# Patient Record
Sex: Female | Born: 1975 | Race: White | Hispanic: No | Marital: Married | Smoking: Current some day smoker
Health system: Southern US, Community
[De-identification: ages and names within clinical notes are randomized; demographics above are authoritative.]

## PROBLEM LIST (undated history)

## (undated) DIAGNOSIS — C801 Malignant (primary) neoplasm, unspecified: Secondary | ICD-10-CM

## (undated) DIAGNOSIS — E039 Hypothyroidism, unspecified: Secondary | ICD-10-CM

## (undated) DIAGNOSIS — F419 Anxiety disorder, unspecified: Secondary | ICD-10-CM

## (undated) DIAGNOSIS — F32A Depression, unspecified: Secondary | ICD-10-CM

## (undated) DIAGNOSIS — R112 Nausea with vomiting, unspecified: Secondary | ICD-10-CM

## (undated) DIAGNOSIS — F329 Major depressive disorder, single episode, unspecified: Secondary | ICD-10-CM

## (undated) DIAGNOSIS — T4145XA Adverse effect of unspecified anesthetic, initial encounter: Secondary | ICD-10-CM

## (undated) DIAGNOSIS — Z9889 Other specified postprocedural states: Secondary | ICD-10-CM

## (undated) DIAGNOSIS — D649 Anemia, unspecified: Secondary | ICD-10-CM

## (undated) DIAGNOSIS — T8859XA Other complications of anesthesia, initial encounter: Secondary | ICD-10-CM

## (undated) HISTORY — PX: ABDOMINAL HYSTERECTOMY: SHX81

## (undated) HISTORY — PX: OTHER SURGICAL HISTORY: SHX169

## (undated) HISTORY — PX: TUBAL LIGATION: SHX77

## (undated) HISTORY — PX: TONSILLECTOMY: SUR1361

---

## 2003-07-03 HISTORY — PX: REDUCTION MAMMAPLASTY: SUR839

## 2005-07-12 ENCOUNTER — Emergency Department (HOSPITAL_COMMUNITY): Admission: EM | Admit: 2005-07-12 | Discharge: 2005-07-12 | Payer: Self-pay | Admitting: Emergency Medicine

## 2005-07-16 ENCOUNTER — Other Ambulatory Visit: Admission: RE | Admit: 2005-07-16 | Discharge: 2005-07-16 | Payer: Self-pay | Admitting: Obstetrics and Gynecology

## 2006-11-13 ENCOUNTER — Inpatient Hospital Stay (HOSPITAL_COMMUNITY): Admission: AD | Admit: 2006-11-13 | Discharge: 2006-11-13 | Payer: Self-pay | Admitting: Obstetrics & Gynecology

## 2006-12-18 ENCOUNTER — Ambulatory Visit (HOSPITAL_COMMUNITY): Admission: RE | Admit: 2006-12-18 | Discharge: 2006-12-18 | Payer: Self-pay | Admitting: Obstetrics and Gynecology

## 2006-12-26 ENCOUNTER — Inpatient Hospital Stay (HOSPITAL_COMMUNITY): Admission: RE | Admit: 2006-12-26 | Discharge: 2006-12-29 | Payer: Self-pay | Admitting: Obstetrics and Gynecology

## 2007-02-17 ENCOUNTER — Ambulatory Visit (HOSPITAL_COMMUNITY): Admission: RE | Admit: 2007-02-17 | Discharge: 2007-02-17 | Payer: Self-pay | Admitting: Obstetrics and Gynecology

## 2008-03-11 ENCOUNTER — Emergency Department (HOSPITAL_BASED_OUTPATIENT_CLINIC_OR_DEPARTMENT_OTHER): Admission: EM | Admit: 2008-03-11 | Discharge: 2008-03-11 | Payer: Self-pay | Admitting: Emergency Medicine

## 2009-01-18 ENCOUNTER — Emergency Department (HOSPITAL_COMMUNITY): Admission: EM | Admit: 2009-01-18 | Discharge: 2009-01-18 | Payer: Self-pay | Admitting: Emergency Medicine

## 2009-11-09 ENCOUNTER — Encounter: Admission: RE | Admit: 2009-11-09 | Discharge: 2009-11-09 | Payer: Self-pay | Admitting: Emergency Medicine

## 2009-11-21 ENCOUNTER — Ambulatory Visit (HOSPITAL_COMMUNITY): Admission: RE | Admit: 2009-11-21 | Discharge: 2009-11-21 | Payer: Self-pay | Admitting: Obstetrics and Gynecology

## 2010-03-09 ENCOUNTER — Ambulatory Visit (HOSPITAL_COMMUNITY): Admission: RE | Admit: 2010-03-09 | Discharge: 2010-03-09 | Payer: Self-pay | Admitting: Obstetrics and Gynecology

## 2010-03-15 ENCOUNTER — Encounter (INDEPENDENT_AMBULATORY_CARE_PROVIDER_SITE_OTHER): Payer: Self-pay | Admitting: Obstetrics and Gynecology

## 2010-03-15 ENCOUNTER — Ambulatory Visit (HOSPITAL_COMMUNITY): Admission: RE | Admit: 2010-03-15 | Discharge: 2010-03-16 | Payer: Self-pay | Admitting: Obstetrics and Gynecology

## 2010-07-14 ENCOUNTER — Emergency Department (HOSPITAL_BASED_OUTPATIENT_CLINIC_OR_DEPARTMENT_OTHER)
Admission: EM | Admit: 2010-07-14 | Discharge: 2010-07-14 | Payer: Self-pay | Source: Home / Self Care | Admitting: Emergency Medicine

## 2010-09-14 LAB — CBC
HCT: 34.5 % — ABNORMAL LOW (ref 36.0–46.0)
Hemoglobin: 15.5 g/dL — ABNORMAL HIGH (ref 12.0–15.0)
MCH: 30.8 pg (ref 26.0–34.0)
MCV: 92.9 fL (ref 78.0–100.0)
Platelets: 123 10*3/uL — ABNORMAL LOW (ref 150–400)
Platelets: 146 10*3/uL — ABNORMAL LOW (ref 150–400)
RBC: 3.72 MIL/uL — ABNORMAL LOW (ref 3.87–5.11)
RBC: 4.36 MIL/uL (ref 3.87–5.11)
RDW: 12.6 % (ref 11.5–15.5)
RDW: 13.4 % (ref 11.5–15.5)
WBC: 10.2 10*3/uL (ref 4.0–10.5)

## 2010-09-14 LAB — SURGICAL PCR SCREEN
MRSA, PCR: NEGATIVE
Staphylococcus aureus: NEGATIVE

## 2010-09-18 LAB — CBC
Hemoglobin: 15 g/dL (ref 12.0–15.0)
MCHC: 34.3 g/dL (ref 30.0–36.0)
Platelets: 161 10*3/uL (ref 150–400)
RBC: 4.78 MIL/uL (ref 3.87–5.11)
WBC: 6 10*3/uL (ref 4.0–10.5)

## 2010-09-18 LAB — PREGNANCY, URINE: Preg Test, Ur: NEGATIVE

## 2010-10-08 LAB — CBC
Hemoglobin: 15.1 g/dL — ABNORMAL HIGH (ref 12.0–15.0)
MCHC: 33.3 g/dL (ref 30.0–36.0)
Platelets: 151 10*3/uL (ref 150–400)
RBC: 5.06 MIL/uL (ref 3.87–5.11)
WBC: 9.7 10*3/uL (ref 4.0–10.5)

## 2010-10-08 LAB — COMPREHENSIVE METABOLIC PANEL
ALT: 21 U/L (ref 0–35)
AST: 22 U/L (ref 0–37)
Alkaline Phosphatase: 49 U/L (ref 39–117)
BUN: 11 mg/dL (ref 6–23)
Calcium: 9.3 mg/dL (ref 8.4–10.5)
Glucose, Bld: 87 mg/dL (ref 70–99)
Potassium: 3.9 mEq/L (ref 3.5–5.1)
Sodium: 139 mEq/L (ref 135–145)

## 2010-10-08 LAB — DIFFERENTIAL
Basophils Absolute: 0 10*3/uL (ref 0.0–0.1)
Monocytes Absolute: 0.3 10*3/uL (ref 0.1–1.0)
Neutro Abs: 8.5 10*3/uL — ABNORMAL HIGH (ref 1.7–7.7)

## 2010-10-08 LAB — LIPASE, BLOOD: Lipase: 24 U/L (ref 11–59)

## 2010-10-08 LAB — URINALYSIS, ROUTINE W REFLEX MICROSCOPIC
Glucose, UA: NEGATIVE mg/dL
Hgb urine dipstick: NEGATIVE
Protein, ur: NEGATIVE mg/dL
Specific Gravity, Urine: 1.012 (ref 1.005–1.030)

## 2010-10-08 LAB — PREGNANCY, URINE: Preg Test, Ur: NEGATIVE

## 2010-11-14 NOTE — Discharge Summary (Signed)
NAMEELOWYN, RAUPP NO.:  [id number]   MEDICAL RECORD NO.:  [id number]          PATIENT TYPE:  INP   LOCATION:  9104                          FACILITY:  [facility]   PHYSICIAN:  Randye Lobo, M.D.   DATE OF BIRTH:  [birth year]   DATE OF ADMISSION:  12/26/2006  DATE OF DISCHARGE:  12/29/2006                               DISCHARGE SUMMARY   FINAL DIAGNOSIS:  Intrauterine pregnancy at term.  History of prior  cesarean section.  The patient desires repeat cesarean section.   PROCEDURE:  Repeat low transverse cesarean section.  Surgeon Dr. Malva Limes.  Assistant Dr. Conley Simmonds.   COMPLICATIONS:  None.   HISTORY OF PRESENT ILLNESS:  This 35 year old G2, P1-0-0-1 presents at  term for repeat cesarean section.  The patient had had a prior cesarean  section with her last pregnancy and desires repeat with this pregnancy  as well.  Otherwise, the patient's antepartum course up to this point  has been uncomplicated.  She did have an abnormal one-hour sugar test  but passed her 3-hour sugar test.  The patient was admitted taken to the  operating room on December 26, 2006, by Dr. Malva Limes where a repeat low  transverse cesarean section was performed with the delivery of a 7 pound  6 ounce female infant with Apgars of 8 and 9.  Delivery went without  complications.  The patient's postoperative course was benign without  any significant fevers.  The patient had some mild postoperative anemia,  was told to start iron on her post hospital course.  The patient was  felt ready for discharge on postoperative day #3.  The baby was having  some tests due to some frequent stools at this time, and she was felt  ready for discharge.  She was sent home on a regular diet, told to  decrease activities, told to continue her prenatal vitamins and her iron  supplement three times daily, was given Percocet 1-2 every 4-6 hours as  needed for pain, told she could use ibuprofen up to 600 mg  every 6 hours  as needed for pain, was to follow up in our office on June 30 for staple  removal.  The patient was also to use AZO Standard as standard as needed  for some dysuria.  She did have a negative urinalysis that was performed  on the 28th.  Instructions and precautions were reviewed with the  patient.   LABS ON DISCHARGE:  The patient had a hemoglobin of 9.3, white blood  cell count of 6.7, platelets of 119,000 and a negative urinalysis.      Leilani Able, P.A.-C.      Randye Lobo, M.D.  Electronically Signed    MB/MEDQ  D:  02/04/2007  T:  02/04/2007  Job:  [id number]

## 2010-11-14 NOTE — Op Note (Signed)
NAME:  Andrea Chapman, Andrea Chapman NO.:  [id number]   MEDICAL RECORD NO.:  [id number]          PATIENT TYPE:  AMB   LOCATION:  SDC                           [facility]ILITY:  [facility]   PHYSICIAN:  Malva Limes, M.D.    DATE OF BIRTH:  [birth year]   DATE OF PROCEDURE:  02/17/2007  DATE OF DISCHARGE:                               OPERATIVE REPORT   PREOPERATIVE DIAGNOSIS:  The patient desires permanent sterilization.   POSTOPERATIVE DIAGNOSIS:  The patient desires permanent sterilization.   PROCEDURE:  Bilateral tubal ligation with Filshie clips.   SURGEON:  Malva Limes, M.D.   ANESTHESIA:  General endotracheal.   ANTIBIOTICS:  Ancef 1 g.   DRAINS:  Catheter, bladder.   ESTIMATED BLOOD LOSS:  Minimal.   COMPLICATIONS:  None.   SPECIMENS:  None.   DESCRIPTION OF PROCEDURE:  The patient was taken to the operating room  where a general anesthetic was administered without complication.  She  was then placed in the dorsal lithotomy position.  She was prepped with  Betadine.  A Hulka tenaculum was applied to the anterior cervical lip.  Her bladder was then drained.  Her umbilicus was then injected with  0.25% Marcaine.   A vertical skin incision was made.  The fascia was then grasped and  entered.  The parietal peritoneum was entered sharply.  An 0 Vicryl  suture was placed in a pursestring fashion.  The Hasson cannula was  placed into the peritoneal cavity.  3 L of carbon dioxide was  insufflated.  The patient was then placed in Trendelenburg.  A Filshie  clip was then placed on the isthmic portion of the left fallopian tube.  The entire tube appeared to be within the clasp.  The clasp appeared to  be tightly closed.  A similar procedure was performed on the opposite  side.  At this point the pneumoperitoneum and instruments were removed.  The fascia was closed with 0 Monocryl suture.  The skin was closed with  4-0 Vicryl suture.   The patient was extubated and taken to the  recovery room in stable  condition.  Instrument and lap counts were correct x1.  The patient was  discharged to home.  She will be discharged with Percocet.  She will  follow up in the office in 4 weeks.           ______________________________  Malva Limes, M.D.     MA/MEDQ  D:  02/17/2007  T:  02/17/2007  Job:  [id number]

## 2010-11-14 NOTE — Op Note (Signed)
Andrea Chapman, Andrea Chapman NO.:  [id number]   MEDICAL RECORD NO.:  [id number]          PATIENT TYPE:  INP   LOCATION:  9104                          [facility]ACILITY:  [facility]   PHYSICIAN:  Malva Limes, M.D.    DATE OF BIRTH:  29-Jul-1975   DATE OF PROCEDURE:  12/26/2006  DATE OF DISCHARGE:                               OPERATIVE REPORT   PREOPERATIVE DIAGNOSES:  1. Intrauterine pregnancy at term.  2. History of prior cesarean section.  3. Patient desires repeat cesarean section.   POSTOPERATIVE DIAGNOSES:  1. Intrauterine pregnancy at term.  2. History of prior cesarean section.  3. Patient desires repeat cesarean section.   PROCEDURE:  Repeat low transverse Cesarean section.   SURGEON:  Dareen Piano.   ASSISTANT:  Edward Jolly.   ANTIBIOTICS:  Ancef 1 gram.   DRAINS:  Foley to bedside drainage.   ESTIMATED BLOOD LOSS:  900 mL.   COMPLICATIONS:  None.   PROCEDURE:  The patient was taken to the operating room where spinal  anesthetic was administered without difficulty. Once this was completed,  the patient was placed in a dorsal supine position with a left lateral  tilt.  She was prepped with Betadine and draped in the usual fashion for  this procedure. A Foley catheter was placed. The patient had a  Pfannenstiel incision made through a previous abdominoplasty scar. This  was carried down to the fascia. The fascia was entered in the midline  and extended laterally with the Mayo scissors. The rectus muscles were  divided in the midline and taken superiorly and inferiorly. The parietal  peritoneum was entered sharply and taken superiorly and inferiorly.  There were several adhesions between the anterior abdominal wall and the  uterus. These were taken down with sharp dissection. A low-transverse  uterine incision was made in the midline and extended laterally with  blunt dissection. The infant was delivered in the vertex presentation  with the vacuum extractor. Once the head  was delivered, a nuchal cord x1  was reduced. The remaining infant was then delivered, the cord was  doubly clamped and cut, and the infant handed to the waiting NICU team.  Placenta was then manually removed. The uterus was exteriorized. The  uterine cavity was examined and wiped with a wet lap. Uterine incision  was closed in a single layer of 0 Monocryl in a running locking fashion.  At this point, hemostasis was checked and felt to be adequate. Parietal  peritoneum and rectus muscles were reapproximated in the midline using 0  Monocryl suture. The fascia was closed in a running fashion using 0  Monocryl suture. The fascia was closed in a running fashion using 0  Monocryl  suture. Subcuticular tissue was made hemostatic with a Bovie. Stainless  steel clips were used to close the skin. The patient tolerated the  procedure well. He was taken to the recovery room in stable condition.  She delivered one live viable female infant weighing 7 pounds and 6  ounces. Apgars were 8 at one minute and 9 at five minutes.  ______________________________  Malva Limes, M.D.     MA/MEDQ  D:  12/26/2006  T:  12/26/2006  Job:  [id number]

## 2010-11-24 ENCOUNTER — Other Ambulatory Visit: Payer: Self-pay | Admitting: Surgery

## 2011-04-13 LAB — CBC
Hemoglobin: 13
MCV: 83.5

## 2011-04-18 LAB — URINALYSIS, ROUTINE W REFLEX MICROSCOPIC
Nitrite: NEGATIVE
Specific Gravity, Urine: 1.01
Urobilinogen, UA: 0.2
pH: 6

## 2011-04-18 LAB — CBC
HCT: 27.3 — ABNORMAL LOW
HCT: 37.6
Hemoglobin: 12.6
MCHC: 33.4
MCHC: 34
MCV: 85.9
Platelets: 156
RBC: 3.18 — ABNORMAL LOW
RDW: 15.1 — ABNORMAL HIGH
WBC: 6.7

## 2011-04-18 LAB — URINE CULTURE: Culture: NO GROWTH

## 2015-04-05 ENCOUNTER — Other Ambulatory Visit: Payer: Self-pay | Admitting: *Deleted

## 2015-04-05 ENCOUNTER — Other Ambulatory Visit: Payer: Self-pay | Admitting: Family Medicine

## 2015-04-05 DIAGNOSIS — Z1231 Encounter for screening mammogram for malignant neoplasm of breast: Secondary | ICD-10-CM

## 2015-04-13 ENCOUNTER — Ambulatory Visit
Admission: RE | Admit: 2015-04-13 | Discharge: 2015-04-13 | Disposition: A | Discharge status: Home / Self Care | Payer: Self-pay | Source: Ambulatory Visit | Attending: Family Medicine | Admitting: Family Medicine

## 2015-04-13 DIAGNOSIS — Z1231 Encounter for screening mammogram for malignant neoplasm of breast: Secondary | ICD-10-CM

## 2015-04-13 HISTORY — DX: Malignant (primary) neoplasm, unspecified: C80.1

## 2015-08-11 ENCOUNTER — Other Ambulatory Visit: Payer: Self-pay | Admitting: Obstetrics and Gynecology

## 2015-08-11 DIAGNOSIS — R102 Pelvic and perineal pain: Secondary | ICD-10-CM

## 2015-08-12 ENCOUNTER — Ambulatory Visit
Admission: RE | Admit: 2015-08-12 | Discharge: 2015-08-12 | Disposition: A | Discharge status: Home / Self Care | Payer: Self-pay | Source: Ambulatory Visit | Attending: Obstetrics and Gynecology | Admitting: Obstetrics and Gynecology

## 2015-08-12 DIAGNOSIS — Z85828 Personal history of other malignant neoplasm of skin: Secondary | ICD-10-CM | POA: Insufficient documentation

## 2015-08-12 DIAGNOSIS — R102 Pelvic and perineal pain: Secondary | ICD-10-CM | POA: Insufficient documentation

## 2015-08-12 DIAGNOSIS — Z90710 Acquired absence of both cervix and uterus: Secondary | ICD-10-CM | POA: Insufficient documentation

## 2015-08-12 MED ORDER — IOHEXOL 350 MG/ML SOLN
100.0000 mL | Freq: Once | INTRAVENOUS | Status: AC | PRN
  Administered 2015-08-12: 100 mL via INTRAVENOUS

## 2016-05-16 ENCOUNTER — Ambulatory Visit
Admission: RE | Admit: 2016-05-16 | Discharge: 2016-05-16 | Disposition: A | Discharge status: Home / Self Care | Payer: Self-pay | Source: Ambulatory Visit | Attending: Oncology | Admitting: Oncology

## 2016-05-16 ENCOUNTER — Ambulatory Visit: Payer: Self-pay | Attending: Oncology

## 2016-05-16 VITALS — BP 129/86 | HR 83 | Temp 97.9°F | Ht 64.57 in | Wt 164.2 lb

## 2016-05-16 DIAGNOSIS — Z0000 Encounter for general adult medical examination without abnormal findings: Secondary | ICD-10-CM

## 2016-05-16 NOTE — Progress Notes (Signed)
Subjective:     Patient ID: Andrea Chapman, female   DOB: Feb 15, 1976, 40 y.o.   MRN: [id number]  HPI   Review of Systems     Objective:   Physical Exam  Pulmonary/Chest: Right breast exhibits no inverted nipple, no mass, no nipple discharge, no skin change and no tenderness. Left breast exhibits no inverted nipple, no mass, no nipple discharge, no skin change and no tenderness. Breasts are symmetrical.         Assessment:     40 year old patient presents for [facility] clinic visit.  Patient screened, and meets BCCCP eligibility.  Patient does not have insurance, Medicare or Medicaid.  Handout given on Affordable Care Act.  Instructed patient on breast self-exam using teach back method.  CBE unremarkable. History of bilateral breast lift. No mass or lump palpated.    Plan:     Sent for bilateral screening mammogram.

## 2016-05-22 NOTE — Progress Notes (Signed)
Letter mailed from [facility] to notify of normal mammogram results.  Patient to return in one year for annual screening.  Copy to [facility]. 

## 2017-01-08 ENCOUNTER — Encounter
Admission: RE | Admit: 2017-01-08 | Discharge: 2017-01-08 | Disposition: A | Discharge status: Home / Self Care | Payer: 59 | Source: Ambulatory Visit | Attending: Obstetrics and Gynecology | Admitting: Obstetrics and Gynecology

## 2017-01-08 DIAGNOSIS — Z01818 Encounter for other preprocedural examination: Secondary | ICD-10-CM | POA: Insufficient documentation

## 2017-01-08 DIAGNOSIS — N83201 Unspecified ovarian cyst, right side: Secondary | ICD-10-CM | POA: Diagnosis not present

## 2017-01-08 HISTORY — DX: Anemia, unspecified: D64.9

## 2017-01-08 HISTORY — DX: Other complications of anesthesia, initial encounter: T88.59XA

## 2017-01-08 HISTORY — DX: Adverse effect of unspecified anesthetic, initial encounter: T41.45XA

## 2017-01-08 HISTORY — DX: Depression, unspecified: F32.A

## 2017-01-08 HISTORY — DX: Major depressive disorder, single episode, unspecified: F32.9

## 2017-01-08 HISTORY — DX: Anxiety disorder, unspecified: F41.9

## 2017-01-08 HISTORY — DX: Hypothyroidism, unspecified: E03.9

## 2017-01-08 LAB — BASIC METABOLIC PANEL
ANION GAP: 10 (ref 5–15)
BUN: 13 mg/dL (ref 6–20)
CHLORIDE: 107 mmol/L (ref 101–111)
CO2: 23 mmol/L (ref 22–32)
Calcium: 9.5 mg/dL (ref 8.9–10.3)
Creatinine, Ser: 0.74 mg/dL (ref 0.44–1.00)
GFR calc non Af Amer: >60 mL/min (ref >60)
GLUCOSE: 75 mg/dL (ref 65–99)
Potassium: 4.3 mmol/L (ref 3.5–5.1)
Sodium: 140 mmol/L (ref 135–145)

## 2017-01-08 LAB — CBC
HEMATOCRIT: 45.3 % (ref 35.0–47.0)
Hemoglobin: 15.4 g/dL (ref 12.0–16.0)
MCH: 30 pg (ref 26.0–34.0)
MCHC: 34 g/dL (ref 32.0–36.0)
MCV: 88.3 fL (ref 80.0–100.0)
PLATELETS: 206 10*3/uL (ref 150–440)
RBC: 5.12 MIL/uL (ref 3.80–5.20)
RDW: 12.7 % (ref 11.5–14.5)
WBC: 7.9 10*3/uL (ref 3.6–11.0)

## 2017-01-08 LAB — TYPE AND SCREEN
ABO/RH(D): A POS
ANTIBODY SCREEN: NEGATIVE

## 2017-01-08 NOTE — Patient Instructions (Signed)
Your procedure is scheduled on: 01/21/17 Mon Report to Same Day Surgery 2nd floor medical mall [facility] Entrance-take elevator on left to 2nd floor.  Check in with surgery information desk.) To find out your arrival time please call [phone] between 1PM - 3PM on 7/2o/18 Fri   Remember: Instructions that are not followed completely may result in serious medical risk, up to and including death, or upon the discretion of your surgeon and anesthesiologist your surgery may need to be rescheduled.    _x___ 1. Do not eat food or drink liquids after midnight. No gum chewing or                              hard candies.     __x__ 2. No Alcohol for 24 hours before or after surgery.   __x__3. No Smoking for 24 prior to surgery.   ____  4. Bring all medications with you on the day of surgery if instructed.    __x__ 5. Notify your doctor if there is any change in your medical condition     (cold, fever, infections).     Do not wear jewelry, make-up, hairpins, clips or nail polish.  Do not wear lotions, powders, or perfumes. You may wear deodorant.  Do not shave 48 hours prior to surgery. Men may shave face and neck.  Do not bring valuables to the hospital.    [facility] is not responsible for any belongings or valuables.               Contacts, dentures or bridgework may not be worn into surgery.  Leave your suitcase in the car. After surgery it may be brought to your room.  For patients admitted to the hospital, discharge time is determined by your                       treatment team.   Patients discharged the day of surgery will not be allowed to drive home.  You will need someone to drive you home and stay with you the night of your procedure.    Please read over the following fact sheets that you were given:   [facility] Preparing for Surgery and or MRSA Information   _x___ Take anti-hypertensive (unless it includes a diuretic), cardiac, seizure, asthma,     anti-reflux and  psychiatric medicines. These include:  1. clonazePAM (KLONOPIN)  2.(NORCO/VICODIN if needed  3.levothyroxine (SYNTHROID  4.venlafaxine (EFFEXOR  5.  6.  ____Fleets enema or Magnesium Citrate as directed.   _x___ Use CHG Soap or sage wipes as directed on instruction sheet   ____ Use inhalers on the day of surgery and bring to hospital day of surgery  ____ Stop Metformin and Janumet 2 days prior to surgery.    ____ Take 1/2 of usual insulin dose the night before surgery and none on the morning     surgery.   _x___ Follow recommendations from Cardiologist, Pulmonologist or PCP regarding          stopping Aspirin, Coumadin, Pllavix ,Eliquis, Effient, or Pradaxa, and Pletal.  X____Stop Anti-inflammatories such as Advil, Aleve, Ibuprofen, Motrin, Naproxen, Naprosyn, Goodies powders or aspirin products. OK to take Tylenol and                          Celebrex.   _x___ Stop supplements until after surgery.  But  may continue Vitamin D, Vitamin B,       and multivitamin.   ____ Bring C-Pap to the hospital.

## 2017-01-08 NOTE — H&P (Signed)
Pelvic pain:  Right lower quadrant pain, distressing particularly with intercourse and worse when entering from behind. Often wraps around from right lower back to anterior, often pain with deep penetration that used to be improved with position change but is now in all positions.   On exam, MSK related and I have referred her to pelvic floor PT.  Ultrasound today wnl. Normal bilateral ovaries, with a 1cm cyst x2 on right ovary. Prior hysterectomy.  Pt brings with her a pelvic xray from her chiropractor which shows a loose Filshie clip in the right lower pelvis.   Hx of significant pelvic adhesions taken down with TAH in 2012. Prior C/S x2, TAH, lap BTL, and "tummy tuck" in surgical past.  - TVUS today found: Hyst ROV simple cysts: 1) 1.21cm 2) 1.22cm LOV appears wnl No FF seen in CDS's  +urinary frequency, urgency and bladder pain occasionally  Past Medical History:  has a past medical history of Depression, unspecified; Dysmenorrhea; Hypothyroidism, unspecified; Melanoma (CMS-HCC) (2010); Tobacco use; and Vitamin B12 deficiency.  Past Surgical History:  has a past surgical history that includes Hysterectomy (2012); Tubal ligation (2008); Breast surgery (2005); Combined abdominoplasty and liposuction (2005); and Cesarean section (2003 & 2008). Family History: family history includes Breast cancer in her maternal aunt; Diabetes in her mother; High blood pressure (Hypertension) in her mother; Hyperlipidemia (Elevated cholesterol) in her mother. Social History:  reports that she has been smoking Cigarettes.  She has been smoking about 1.00 pack per day. She has never used smokeless tobacco. She reports that she drinks alcohol. She reports that she does not use drugs. OB/GYN History:          OB History    Gravida Para Term Preterm AB Living   2 2 2     2    SAB TAB Ectopic Molar Multiple Live Births                    Allergies: is  allergic to morphine; reglan [metoclopramide hcl]; and azithromycin. Medications:  Current Outpatient Prescriptions:  .  clonazePAM (KLONOPIN) 0.5 MG tablet, Take 0.5 mg by mouth 2 (two) times daily as needed for Anxiety., Disp: , Rfl:  .  HYDROcodone-acetaminophen (NORCO) 5-325 mg tablet, Take 1 tablet by mouth every 6 (six) hours as needed for Pain., Disp: , Rfl:  .  levothyroxine (SYNTHROID, LEVOTHROID) 200 MCG tablet, Take 200 mcg by mouth once daily. Take on an empty stomach with a glass of water at least 30-60 minutes before breakfast., Disp: , Rfl:  .  venlafaxine (EFFEXOR) 100 MG tablet, Take 100 mg by mouth 3 (three) times daily., Disp: , Rfl:   Review of Systems: No SOB, no palpitations or chest pain, no new lower extremity edema, no nausea or vomiting or bowel or bladder complaints. See HPI for gyn specific ROS.    Exam:      Vitals:   12/25/16 1504  BP: (!) 136/96  Pulse: 96   Body mass index is 29.35 kg/m.  General: Well-developed, well-nourished  female in no acute distress Body mass index is 29.35 kg/m. Skin: No rashes, ulcers or skin lesions noted. No excessive hirsutism or acne noted.  Pulm: CTAB,  CV: RRR, no murmurs Neurological: Appears alert and oriented and is a good historian. No gross abnormalities are noted. Psychological: Normal affect and mood. No signs of anxiety or depression noted.  Pelvic exam: Pelvic:              External  genitalia: vulva and labia without lesions, Tanner stage 5             Urethra: no prolapse             Vagina: normal physiologic d/c             Cuff: no lesions, no cervical motion tenderness               Uterus: surgically absent                    Adnexa: no mass,  non-tender               Rectovaginal: external exam normal  +TTP along right vaginal wall, +obturator tenderness, +bladder tenderness  Impression:   The primary encounter diagnosis was Pelvic pain in female. A diagnosis of Ovarian cyst, right  was also pertinent to this visit.    Plan:   - Patient returns for a preoperative discussion regarding her plans to proceed with surgical treatment of her right ovarian cysts with chronic pain by dx lap with right oopherectomy procedure.If we can find it, we will also remove her Filshie clip in her pelvis. We will also proceed with cystoscopy for hydro distention and bladder pain.  Because of her prior hx of significant pelvic adhesions taken down with TAH in 2012. Prior C/S x2, TAH, lap BTL, and "tummy tuck" in surgical past, she is aware of the possibility that are diagnostic laparoscopy will be unsuccessful at removing her right ovary.  If this occurs, we have decided that we would not proceed with an open incision, but that I would send her to the minimally invasive specialist.  She is aware that she is more at risk for abnormal intra-abdominal anatomy, and that that leads to surgical risk for organ damage.  She is aware that I would do an open case if needed to for safety.    I did offer to send her to the minimally invasive specialist at this time.  She declines, and would like to have her surgery locally if at all possible.  Because of her chronic pain and her family history of ovarian cancer premenopausally, I will proceed with a CA 125 prior to surgery.  However, my suspicion for malignancy in her case is very low.  The patient and I discussed the technical aspects of the procedure including the potential for risks and complications. These include but are not limited to the risk of infection requiring post-operative antibiotics or further procedures. We talked about the risk of injury to adjacent organs including bladder, bowel, ureter, blood vessels or nerves. We talked about the need to convert to an open incision. We talked about the possible need for blood transfusion. We talked aboutpostop complications such asthromboembolic or cardiopulmonary complications. All of  her questions were answered.  Her preoperative exam was completed and the appropriate consents were signed. She is scheduled to undergo this procedure in the near future.  Specific Peri-operative Considerations:  - Consent: obtained today - Health Maintenance: uptodate - Labs: CBC, CMP, CA125 preoperatively - Studies: EKG, CXR preoperatively - Bowel Preparation: None required - Abx:  None required - VTE ppx: SCDs perioperatively - Glucose Protocol: n/a - Beta-blockade: n/a

## 2017-01-21 ENCOUNTER — Ambulatory Visit
Admission: RE | Admit: 2017-01-21 | Discharge: 2017-01-21 | Disposition: A | Discharge status: Home / Self Care | Payer: 59 | Source: Ambulatory Visit | Attending: Obstetrics and Gynecology | Admitting: Obstetrics and Gynecology

## 2017-01-21 ENCOUNTER — Encounter: Payer: Self-pay | Admitting: *Deleted

## 2017-01-21 ENCOUNTER — Encounter
Admission: RE | Disposition: A | Discharge status: Home / Self Care | Payer: Self-pay | Source: Ambulatory Visit | Attending: Obstetrics and Gynecology

## 2017-01-21 ENCOUNTER — Ambulatory Visit: Payer: 59 | Admitting: Certified Registered Nurse Anesthetist

## 2017-01-21 DIAGNOSIS — Z881 Allergy status to other antibiotic agents status: Secondary | ICD-10-CM | POA: Diagnosis not present

## 2017-01-21 DIAGNOSIS — Z803 Family history of malignant neoplasm of breast: Secondary | ICD-10-CM | POA: Diagnosis not present

## 2017-01-21 DIAGNOSIS — N838 Other noninflammatory disorders of ovary, fallopian tube and broad ligament: Secondary | ICD-10-CM | POA: Insufficient documentation

## 2017-01-21 DIAGNOSIS — Z8041 Family history of malignant neoplasm of ovary: Secondary | ICD-10-CM | POA: Diagnosis not present

## 2017-01-21 DIAGNOSIS — G8929 Other chronic pain: Secondary | ICD-10-CM | POA: Diagnosis not present

## 2017-01-21 DIAGNOSIS — E669 Obesity, unspecified: Secondary | ICD-10-CM | POA: Diagnosis not present

## 2017-01-21 DIAGNOSIS — Z85820 Personal history of malignant melanoma of skin: Secondary | ICD-10-CM | POA: Insufficient documentation

## 2017-01-21 DIAGNOSIS — R102 Pelvic and perineal pain: Secondary | ICD-10-CM | POA: Insufficient documentation

## 2017-01-21 DIAGNOSIS — N8311 Corpus luteum cyst of right ovary: Secondary | ICD-10-CM | POA: Insufficient documentation

## 2017-01-21 DIAGNOSIS — Z833 Family history of diabetes mellitus: Secondary | ICD-10-CM | POA: Diagnosis not present

## 2017-01-21 DIAGNOSIS — Z6829 Body mass index (BMI) 29.0-29.9, adult: Secondary | ICD-10-CM | POA: Diagnosis not present

## 2017-01-21 DIAGNOSIS — N946 Dysmenorrhea, unspecified: Secondary | ICD-10-CM | POA: Diagnosis not present

## 2017-01-21 DIAGNOSIS — E039 Hypothyroidism, unspecified: Secondary | ICD-10-CM | POA: Insufficient documentation

## 2017-01-21 DIAGNOSIS — F329 Major depressive disorder, single episode, unspecified: Secondary | ICD-10-CM | POA: Diagnosis not present

## 2017-01-21 DIAGNOSIS — D649 Anemia, unspecified: Secondary | ICD-10-CM | POA: Insufficient documentation

## 2017-01-21 DIAGNOSIS — R350 Frequency of micturition: Secondary | ICD-10-CM | POA: Insufficient documentation

## 2017-01-21 DIAGNOSIS — Z885 Allergy status to narcotic agent status: Secondary | ICD-10-CM | POA: Diagnosis not present

## 2017-01-21 DIAGNOSIS — F418 Other specified anxiety disorders: Secondary | ICD-10-CM | POA: Diagnosis not present

## 2017-01-21 DIAGNOSIS — Z888 Allergy status to other drugs, medicaments and biological substances status: Secondary | ICD-10-CM | POA: Diagnosis not present

## 2017-01-21 DIAGNOSIS — Z8249 Family history of ischemic heart disease and other diseases of the circulatory system: Secondary | ICD-10-CM | POA: Diagnosis not present

## 2017-01-21 DIAGNOSIS — Z79899 Other long term (current) drug therapy: Secondary | ICD-10-CM | POA: Diagnosis not present

## 2017-01-21 DIAGNOSIS — N8301 Follicular cyst of right ovary: Secondary | ICD-10-CM | POA: Insufficient documentation

## 2017-01-21 DIAGNOSIS — F17210 Nicotine dependence, cigarettes, uncomplicated: Secondary | ICD-10-CM | POA: Insufficient documentation

## 2017-01-21 DIAGNOSIS — Z90710 Acquired absence of both cervix and uterus: Secondary | ICD-10-CM | POA: Insufficient documentation

## 2017-01-21 DIAGNOSIS — N736 Female pelvic peritoneal adhesions (postinfective): Secondary | ICD-10-CM | POA: Insufficient documentation

## 2017-01-21 HISTORY — PX: LAPAROSCOPIC BILATERAL SALPINGECTOMY: SHX5889

## 2017-01-21 HISTORY — PX: LAPAROSCOPY: SHX197

## 2017-01-21 HISTORY — PX: LAPAROSCOPIC LYSIS OF ADHESIONS: SHX5905

## 2017-01-21 LAB — ABO/RH: ABO/RH(D): A POS

## 2017-01-21 SURGERY — LAPAROSCOPY OPERATIVE
Anesthesia: General | Laterality: Right

## 2017-01-21 MED ORDER — SUCCINYLCHOLINE CHLORIDE 20 MG/ML IJ SOLN
INTRAMUSCULAR | Status: DC | PRN
  Administered 2017-01-21: 100 mg via INTRAVENOUS

## 2017-01-21 MED ORDER — SCOPOLAMINE 1 MG/3DAYS TD PT72
MEDICATED_PATCH | TRANSDERMAL | Status: AC
  Administered 2017-01-21: 1.5 mg via TRANSDERMAL
  Filled 2017-01-21: qty 1

## 2017-01-21 MED ORDER — OXYCODONE HCL 5 MG PO CAPS: 5.0000 mg | ORAL_CAPSULE | Freq: Four times a day (QID) | ORAL | 0 refills | Status: DC | PRN

## 2017-01-21 MED ORDER — ACETAMINOPHEN 10 MG/ML IV SOLN
INTRAVENOUS | Status: DC | PRN
  Administered 2017-01-21: 1000 mg via INTRAVENOUS

## 2017-01-21 MED ORDER — ACETAMINOPHEN 500 MG PO TABS
ORAL_TABLET | ORAL | Status: AC
  Filled 2017-01-21: qty 2

## 2017-01-21 MED ORDER — ONDANSETRON HCL 4 MG/2ML IJ SOLN
INTRAMUSCULAR | Status: AC
  Filled 2017-01-21: qty 2

## 2017-01-21 MED ORDER — GABAPENTIN 800 MG PO TABS: 800.0000 mg | ORAL_TABLET | Freq: Every day | ORAL | 0 refills | Status: DC

## 2017-01-21 MED ORDER — GABAPENTIN 300 MG PO CAPS
900.0000 mg | ORAL_CAPSULE | ORAL | Status: AC
  Administered 2017-01-21: 900 mg via ORAL

## 2017-01-21 MED ORDER — GABAPENTIN 300 MG PO CAPS
ORAL_CAPSULE | ORAL | Status: AC
  Filled 2017-01-21: qty 3

## 2017-01-21 MED ORDER — LIDOCAINE HCL (CARDIAC) 20 MG/ML IV SOLN
INTRAVENOUS | Status: DC | PRN
  Administered 2017-01-21: 80 mg via INTRAVENOUS

## 2017-01-21 MED ORDER — SUCCINYLCHOLINE CHLORIDE 20 MG/ML IJ SOLN
INTRAMUSCULAR | Status: AC
  Filled 2017-01-21: qty 1

## 2017-01-21 MED ORDER — PROPOFOL 10 MG/ML IV BOLUS
INTRAVENOUS | Status: DC | PRN
  Administered 2017-01-21: 200 mg via INTRAVENOUS

## 2017-01-21 MED ORDER — ROCURONIUM BROMIDE 50 MG/5ML IV SOLN
INTRAVENOUS | Status: AC
  Filled 2017-01-21: qty 1

## 2017-01-21 MED ORDER — PROPOFOL 500 MG/50ML IV EMUL
INTRAVENOUS | Status: DC | PRN
  Administered 2017-01-21: 20 ug/kg/min via INTRAVENOUS

## 2017-01-21 MED ORDER — BUPIVACAINE HCL (PF) 0.5 % IJ SOLN
INTRAMUSCULAR | Status: AC
Start: 2017-01-21 — End: 2017-01-21
  Filled 2017-01-21: qty 30

## 2017-01-21 MED ORDER — KETOROLAC TROMETHAMINE 30 MG/ML IJ SOLN
INTRAMUSCULAR | Status: DC | PRN
  Administered 2017-01-21: 30 mg via INTRAVENOUS

## 2017-01-21 MED ORDER — KETOROLAC TROMETHAMINE 30 MG/ML IJ SOLN
INTRAMUSCULAR | Status: AC
  Filled 2017-01-21: qty 1

## 2017-01-21 MED ORDER — ACETAMINOPHEN NICU IV SYRINGE 10 MG/ML
INTRAVENOUS | Status: AC
  Filled 2017-01-21: qty 2

## 2017-01-21 MED ORDER — SUGAMMADEX SODIUM 200 MG/2ML IV SOLN
INTRAVENOUS | Status: DC | PRN
  Administered 2017-01-21: 175 mg via INTRAVENOUS

## 2017-01-21 MED ORDER — FAMOTIDINE 20 MG PO TABS
20.0000 mg | ORAL_TABLET | Freq: Once | ORAL | Status: AC
  Administered 2017-01-21: 20 mg via ORAL

## 2017-01-21 MED ORDER — BUPIVACAINE HCL 0.5 % IJ SOLN
INTRAMUSCULAR | Status: DC | PRN
  Administered 2017-01-21: 10 mL

## 2017-01-21 MED ORDER — FENTANYL CITRATE (PF) 100 MCG/2ML IJ SOLN
INTRAMUSCULAR | Status: AC
  Filled 2017-01-21: qty 2

## 2017-01-21 MED ORDER — PROMETHAZINE HCL 25 MG/ML IJ SOLN: 6.2500 mg | INTRAMUSCULAR | Status: DC | PRN

## 2017-01-21 MED ORDER — MIDAZOLAM HCL 2 MG/2ML IJ SOLN
INTRAMUSCULAR | Status: DC | PRN
  Administered 2017-01-21: 2 mg via INTRAVENOUS

## 2017-01-21 MED ORDER — SCOPOLAMINE 1 MG/3DAYS TD PT72
1.0000 | MEDICATED_PATCH | TRANSDERMAL | Status: DC
  Administered 2017-01-21: 1.5 mg via TRANSDERMAL

## 2017-01-21 MED ORDER — DEXAMETHASONE SODIUM PHOSPHATE 10 MG/ML IJ SOLN
INTRAMUSCULAR | Status: DC | PRN
  Administered 2017-01-21: 10 mg via INTRAVENOUS

## 2017-01-21 MED ORDER — MIDAZOLAM HCL 2 MG/2ML IJ SOLN
INTRAMUSCULAR | Status: AC
  Filled 2017-01-21: qty 2

## 2017-01-21 MED ORDER — ACETAMINOPHEN 500 MG PO TABS: 1000.0000 mg | ORAL_TABLET | Freq: Four times a day (QID) | ORAL | 0 refills | Status: AC

## 2017-01-21 MED ORDER — ROCURONIUM BROMIDE 100 MG/10ML IV SOLN
INTRAVENOUS | Status: DC | PRN
  Administered 2017-01-21: 35 mg via INTRAVENOUS
  Administered 2017-01-21: 5 mg via INTRAVENOUS

## 2017-01-21 MED ORDER — SUGAMMADEX SODIUM 200 MG/2ML IV SOLN
INTRAVENOUS | Status: AC
  Filled 2017-01-21: qty 2

## 2017-01-21 MED ORDER — ONDANSETRON HCL 4 MG/2ML IJ SOLN
INTRAMUSCULAR | Status: DC | PRN
  Administered 2017-01-21: 4 mg via INTRAVENOUS

## 2017-01-21 MED ORDER — DOCUSATE SODIUM 100 MG PO CAPS: 100.0000 mg | ORAL_CAPSULE | Freq: Two times a day (BID) | ORAL | 0 refills | Status: DC

## 2017-01-21 MED ORDER — FAMOTIDINE 20 MG PO TABS
ORAL_TABLET | ORAL | Status: AC
  Administered 2017-01-21: 20 mg via ORAL
  Filled 2017-01-21: qty 1

## 2017-01-21 MED ORDER — OXYCODONE HCL 5 MG PO TABS
ORAL_TABLET | ORAL | Status: AC
  Filled 2017-01-21: qty 1

## 2017-01-21 MED ORDER — FENTANYL CITRATE (PF) 100 MCG/2ML IJ SOLN
INTRAMUSCULAR | Status: DC | PRN
  Administered 2017-01-21 (×2): 50 ug via INTRAVENOUS

## 2017-01-21 MED ORDER — LIDOCAINE HCL (PF) 2 % IJ SOLN
INTRAMUSCULAR | Status: AC
  Filled 2017-01-21: qty 2

## 2017-01-21 MED ORDER — OXYCODONE HCL 5 MG PO TABS
5.0000 mg | ORAL_TABLET | Freq: Once | ORAL | Status: AC | PRN
  Administered 2017-01-21: 5 mg via ORAL

## 2017-01-21 MED ORDER — LACTATED RINGERS IV SOLN: INTRAVENOUS | Status: DC

## 2017-01-21 MED ORDER — LACTATED RINGERS IV SOLN
INTRAVENOUS | Status: DC
  Administered 2017-01-21 (×2): via INTRAVENOUS

## 2017-01-21 MED ORDER — MEPERIDINE HCL 50 MG/ML IJ SOLN: 6.2500 mg | INTRAMUSCULAR | Status: DC | PRN

## 2017-01-21 MED ORDER — IBUPROFEN 800 MG PO TABS: 800.0000 mg | ORAL_TABLET | Freq: Three times a day (TID) | ORAL | 1 refills | Status: DC | PRN

## 2017-01-21 MED ORDER — OXYCODONE HCL 5 MG/5ML PO SOLN: 5.0000 mg | Freq: Once | ORAL | Status: AC | PRN

## 2017-01-21 MED ORDER — FENTANYL CITRATE (PF) 100 MCG/2ML IJ SOLN
25.0000 ug | INTRAMUSCULAR | Status: DC | PRN
  Administered 2017-01-21 (×3): 50 ug via INTRAVENOUS

## 2017-01-21 MED ORDER — DEXAMETHASONE SODIUM PHOSPHATE 10 MG/ML IJ SOLN
INTRAMUSCULAR | Status: AC
  Filled 2017-01-21: qty 1

## 2017-01-21 MED ORDER — ACETAMINOPHEN 500 MG PO TABS
1000.0000 mg | ORAL_TABLET | ORAL | Status: AC
  Administered 2017-01-21: 1000 mg via ORAL

## 2017-01-21 MED ORDER — PROPOFOL 10 MG/ML IV BOLUS
INTRAVENOUS | Status: AC
  Filled 2017-01-21: qty 20

## 2017-01-21 SURGICAL SUPPLY — 45 items
ADH SKN CLS APL DERMABOND .7 (GAUZE/BANDAGES/DRESSINGS) ×3
BAG SPEC RTRVL LRG 6X4 10 (ENDOMECHANICALS) ×3
BAG URO DRAIN 2000ML W/SPOUT (MISCELLANEOUS) ×5 IMPLANT
BLADE SURG SZ11 CARB STEEL (BLADE) ×5 IMPLANT
CATH FOLEY 2WAY  5CC 16FR (CATHETERS) ×2
CATH FOLEY 2WAY 5CC 16FR (CATHETERS) ×3
CATH ROBINSON RED A/P 16FR (CATHETERS) ×5 IMPLANT
CATH URTH 16FR FL 2W BLN LF (CATHETERS) ×3 IMPLANT
CHLORAPREP W/TINT 26ML (MISCELLANEOUS) ×5 IMPLANT
CLOSURE WOUND 1/4X4 (GAUZE/BANDAGES/DRESSINGS) ×1
DERMABOND ADVANCED (GAUZE/BANDAGES/DRESSINGS) ×2
DERMABOND ADVANCED .7 DNX12 (GAUZE/BANDAGES/DRESSINGS) ×3 IMPLANT
DRSG TEGADERM 2-3/8X2-3/4 SM (GAUZE/BANDAGES/DRESSINGS) ×15 IMPLANT
GAUZE SPONGE NON-WVN 2X2 STRL (MISCELLANEOUS) ×6 IMPLANT
GLOVE BIO SURGEON STRL SZ7 (GLOVE) ×10 IMPLANT
GLOVE INDICATOR 7.5 STRL GRN (GLOVE) ×5 IMPLANT
GOWN STRL REUS W/ TWL LRG LVL3 (GOWN DISPOSABLE) ×6 IMPLANT
GOWN STRL REUS W/TWL LRG LVL3 (GOWN DISPOSABLE) ×10
IRRIGATION STRYKERFLOW (MISCELLANEOUS) ×3 IMPLANT
IRRIGATOR STRYKERFLOW (MISCELLANEOUS) ×5
IV NS 1000ML (IV SOLUTION) ×5
IV NS 1000ML BAXH (IV SOLUTION) ×3 IMPLANT
KIT RM TURNOVER CYSTO AR (KITS) ×5 IMPLANT
LABEL OR SOLS (LABEL) ×5 IMPLANT
LIGASURE LAP MARYLAND 5MM 37CM (ELECTROSURGICAL) ×5 IMPLANT
NEEDLE VERESS 14GA 120MM (NEEDLE) ×2 IMPLANT
NS IRRIG 500ML POUR BTL (IV SOLUTION) ×5 IMPLANT
PACK GYN LAPAROSCOPIC (MISCELLANEOUS) ×5 IMPLANT
PAD OB MATERNITY 4.3X12.25 (PERSONAL CARE ITEMS) ×5 IMPLANT
PAD PREP 24X41 OB/GYN DISP (PERSONAL CARE ITEMS) ×5 IMPLANT
POUCH SPECIMEN RETRIEVAL 10MM (ENDOMECHANICALS) ×5 IMPLANT
SCISSORS METZENBAUM CVD 33 (INSTRUMENTS) ×5 IMPLANT
SLEEVE ENDOPATH XCEL 5M (ENDOMECHANICALS) ×7 IMPLANT
SPONGE VERSALON 2X2 STRL (MISCELLANEOUS) ×10
STRIP CLOSURE SKIN 1/4X4 (GAUZE/BANDAGES/DRESSINGS) ×4 IMPLANT
SUT MNCRL AB 4-0 PS2 18 (SUTURE) ×5 IMPLANT
SUT VIC AB 2-0 UR6 27 (SUTURE) ×5 IMPLANT
SUT VIC AB 4-0 SH 27 (SUTURE) ×5
SUT VIC AB 4-0 SH 27XANBCTRL (SUTURE) ×3 IMPLANT
SUT VICRYL 0 AB UR-6 (SUTURE) ×2 IMPLANT
SWABSTK COMLB BENZOIN TINCTURE (MISCELLANEOUS) ×5 IMPLANT
TROCAR ENDO BLADELESS 11MM (ENDOMECHANICALS) ×5 IMPLANT
TROCAR XCEL NON-BLD 5MMX100MML (ENDOMECHANICALS) ×5 IMPLANT
TROCAR XCEL UNIV SLVE 11M 100M (ENDOMECHANICALS) ×5 IMPLANT
TUBING INSUFFLATOR HI FLOW (MISCELLANEOUS) ×5 IMPLANT

## 2017-01-21 NOTE — Interval H&P Note (Signed)
History and Physical Interval Note:  01/21/2017 11:57 AM  Andrea Chapman  has presented today for surgery, with the diagnosis of Chronic Pelvic Pain  Right Ovarian Cyst  The various methods of treatment have been discussed with the patient and family. After consideration of risks, benefits and other options for treatment, the patient has consented to  Procedure(s): LAPAROSCOPY OPERATIVE (N/A) LAPAROSCOPIC OOPHORECTOMY (Right) as a surgical intervention .  The patient's history has been reviewed, patient examined, no change in status, stable for surgery.  I have reviewed the patient's chart and labs.  Questions were answered to the patient's satisfaction.     Benjaman Kindler

## 2017-01-21 NOTE — Discharge Instructions (Signed)
Laparoscopic Ovarian Surgery Discharge Instructions  RISKS AND COMPLICATIONS   Infection.  Bleeding.  Injury to surrounding organs.  Anesthetic side effects.   PROCEDURE   You may be given a medicine to help you relax (sedative) before the procedure. You will be given a medicine to make you sleep (general anesthetic) during the procedure.  A tube will be put down your throat to help your breath while under general anesthesia.  Several small cuts (incisions) are made in the lower abdominal area and one incision is made near the belly button.  Your abdominal area will be inflated with a safe gas (carbon dioxide). This helps give the surgeon room to operate, visualize, and helps the surgeon avoid other organs.  A thin, lighted tube (laparoscope) with a camera attached is inserted into your abdomen through the incision near the belly button. Other small instruments may also be inserted through other abdominal incisions.  The ovary is located and are removed.  After the ovary is removed, the gas is released from the abdomen.  The incisions will be closed with stitches (sutures), and Dermabond. A bandage may be placed over the incisions.  AFTER THE PROCEDURE   You will also have some mild abdominal discomfort for 3-7 days. You will be given pain medicine to ease any discomfort.  As long as there are no problems, you may be allowed to go home. Someone will need to drive you home and be with you for at least 24 hours once home.  You may have some mild discomfort in the throat. This is from the tube placed in your throat while you were sleeping.  You may experience discomfort in the shoulder area from some trapped air between the liver and diaphragm. This sensation is normal and will slowly go away on its own.  HOME CARE INSTRUCTIONS   Take all medicines as directed.  Only take over-the-counter or prescription medicines for pain, discomfort, or fever as directed by your  caregiver.  Resume daily activities as directed.  Showers are preferred over baths for 2 weeks.  You may resume sexual activities in 1 week or as you feel you would like to.  Do not drive while taking narcotics.  SEEK MEDICAL CARE IF: .  There is increasing abdominal pain.  You feel lightheaded or faint.  You have the chills.  You have an oral temperature above 102 F (38.9 C).  There is pus-like (purulent) drainage from any of the wounds.  You are unable to pass gas or have a bowel movement.  You feel sick to your stomach (nauseous) or throw up (vomit) and can't control it with your medicines.  MAKE SURE YOU:   Understand these instructions.  Will watch your condition.  Will get help right away if you are not doing well or get worse.  ExitCare Patient Information 2013 [place].     Laparoscopic Ovarian Surgery Discharge Instructions  RISKS AND COMPLICATIONS   Infection.  Bleeding.  Injury to surrounding organs.  Anesthetic side effects.   PROCEDURE   You may be given a medicine to help you relax (sedative) before the procedure. You will be given a medicine to make you sleep (general anesthetic) during the procedure.  A tube will be put down your throat to help your breath while under general anesthesia.  Several small cuts (incisions) are made in the lower abdominal area and one incision is made near the belly button.  Your abdominal area will be inflated with a safe gas (  carbon dioxide). This helps give the surgeon room to operate, visualize, and helps the surgeon avoid other organs.  A thin, lighted tube (laparoscope) with a camera attached is inserted into your abdomen through the incision near the belly button. Other small instruments may also be inserted through other abdominal incisions.  The ovary is located and are removed.  After the ovary is removed, the gas is released from the abdomen.  The incisions will be closed with stitches  (sutures), and Dermabond. A bandage may be placed over the incisions.  AFTER THE PROCEDURE   You will also have some mild abdominal discomfort for 3-7 days. You will be given pain medicine to ease any discomfort.  As long as there are no problems, you may be allowed to go home. Someone will need to drive you home and be with you for at least 24 hours once home.  You may have some mild discomfort in the throat. This is from the tube placed in your throat while you were sleeping.  You may experience discomfort in the shoulder area from some trapped air between the liver and diaphragm. This sensation is normal and will slowly go away on its own.  HOME CARE INSTRUCTIONS   Take all medicines as directed.  Only take over-the-counter or prescription medicines for pain, discomfort, or fever as directed by your caregiver.  Resume daily activities as directed.  Showers are preferred over baths for 2 weeks.  You may resume sexual activities in 1 week or as you feel you would like to.  Do not drive while taking narcotics.  SEEK MEDICAL CARE IF: .  There is increasing abdominal pain.  You feel lightheaded or faint.  You have the chills.  You have an oral temperature above 102 F (38.9 C).  There is pus-like (purulent) drainage from any of the wounds.  You are unable to pass gas or have a bowel movement.  You feel sick to your stomach (nauseous) or throw up (vomit) and can't control it with your medicines.  MAKE SURE YOU:   Understand these instructions.  Will watch your condition.  Will get help right away if you are not doing well or get worse.  ExitCare Patient Information 2013 [place].  AMBULATORY SURGERY  DISCHARGE INSTRUCTIONS   1) The drugs that you were given will stay in your system until tomorrow so for the next 24 hours you should not:  A) Drive an automobile B) Make any legal decisions C) Drink any alcoholic beverage   2) You may resume regular  meals tomorrow.  Today it is better to start with liquids and gradually work up to solid foods.  You may eat anything you prefer, but it is better to start with liquids, then soup and crackers, and gradually work up to solid foods.   3) Please notify your doctor immediately if you have any unusual bleeding, trouble breathing, redness and pain at the surgery site, drainage, fever, or pain not relieved by medication.    4) Additional Instructions: TAKE A STOOL SOFTENER TWICE A DAY WHILE TAKING NARCOTIC PAIN MEDICINE TO PREVENT CONSTIPATION   Please contact your physician with any problems or Same Day Surgery at [phone], Monday through Friday 6 am to 4 pm, or [place] at [facility] number at [phone].

## 2017-01-21 NOTE — Progress Notes (Signed)
Patient rubbing and pushing around on her tummy, Asked to please not do this, she may make her abdomen Hurt more.  Spoke with patient about anesthesia and how It makes you not remember or aware of some things, She may accidentally rub her abdomen too hard.   Patient verbalized understanding.

## 2017-01-21 NOTE — Anesthesia Postprocedure Evaluation (Signed)
Anesthesia Post Note  Patient: KATRIANA DORTCH  Procedure(s) Performed: Procedure(s) (LRB): LAPAROSCOPY OPERATIVE (N/A) LAPAROSCOPIC Right OOPHORECTOMY (Right) LAPAROSCOPIC LYSIS OF ADHESIONS (N/A) LAPAROSCOPIC BILATERAL SALPINGECTOMY (Bilateral)  Patient location during evaluation: PACU Anesthesia Type: General Level of consciousness: awake and alert and oriented Pain management: pain level controlled Vital Signs Assessment: post-procedure vital signs reviewed and stable Respiratory status: spontaneous breathing, nonlabored ventilation and respiratory function stable Cardiovascular status: blood pressure returned to baseline and stable Postop Assessment: no signs of nausea or vomiting Anesthetic complications: no     Last Vitals:  Vitals:   01/21/17 1355 01/21/17 1400  BP: 119/74   Pulse: 79 84  Resp: 15 18  Temp:  36.7 C    Last Pain:  Vitals:   01/21/17 1349  TempSrc:   PainSc: 5                  Rexanna Louthan

## 2017-01-21 NOTE — Anesthesia Post-op Follow-up Note (Cosign Needed)
Anesthesia QCDR form completed.        

## 2017-01-21 NOTE — Anesthesia Procedure Notes (Signed)
Procedure Name: Intubation Date/Time: 01/21/2017 12:11 PM Performed by: Johnna Acosta Pre-anesthesia Checklist: Patient identified, Emergency Drugs available, Suction available, Patient being monitored and Timeout performed Patient Re-evaluated:Patient Re-evaluated prior to induction Oxygen Delivery Method: Circle system utilized Preoxygenation: Pre-oxygenation with 100% oxygen Induction Type: IV induction Ventilation: Mask ventilation without difficulty Laryngoscope Size: Miller and 2 Grade View: Grade I Tube type: Oral Tube size: 7.0 mm Number of attempts: 1 Airway Equipment and Method: Stylet Placement Confirmation: ETT inserted through vocal cords under direct vision,  positive ETCO2 and breath sounds checked- equal and bilateral Secured at: 22 cm Tube secured with: Tape Dental Injury: Teeth and Oropharynx as per pre-operative assessment

## 2017-01-21 NOTE — Op Note (Signed)
Andrea Chapman PROCEDURE DATE: 01/21/2017  PREOPERATIVE DIAGNOSIS: Chronic pelvic pain POSTOPERATIVE DIAGNOSIS: Pelvic adhesions PROCEDURE: Diagnostic laparoscopy, right salpingo-opherectomy, left salpingectomy, lysis of pelvic adhesions SURGEON:  Dr. Benjaman Kindler ASSISTANT: Dr. Marcello Moores Schermerhorn ANESTHESIOLOGIST: Randa Lynn, Amy, MD Anesthesiologist: Emmie Niemann, MD CRNA: Johnna Acosta, CRNA  INDICATIONS: 41 y.o. with a prior hx of TAH with history of chronic right sided pelvic pain desiring surgical evaluation.   Please see preoperative notes for further details.  Risks of surgery were discussed with the patient including but not limited to: bleeding which may require transfusion or reoperation; infection which may require antibiotics; injury to bowel, bladder, ureters or other surrounding organs; need for additional procedures including laparotomy; thromboembolic phenomenon, incisional problems and other pelvic adhesions between the epiploica and the anterior left-sided pelvic sidewall. Omental adhesions in the anterior left pelvis, at the level of the umbilicus and below. Normal left ovary, free right ovary. No other abdominal/pelvic abnormality.  Normal upper abdomen.  ANESTHESIA:    General INTRAVENOUS FLUIDS: 800 ml ESTIMATED BLOOD LOSS: 20 ml URINE OUTPUT: 50 ml SPECIMENS: right ovary and tube, left tube COMPLICATIONS: None immediate  PROCEDURE IN DETAIL:  The patient had sequential compression devices applied to her lower extremities while in the preoperative area.  She was then taken to the operating room where general anesthesia was administered and was found to be adequate.  She was placed in the dorsal lithotomy position, and was prepped and draped in a sterile manner.  A red rubber catheter was inserted into her bladder for an estimated amount of clear urine.  After an adequate timeout was performed, attention was turned to the abdomen.  At Palmer's point and left upper  quadrant, a varies needle was inserted to 3 clicks, and gas attached. Her opening pressure was 6 mmHg. She was insufflated to 15 mmHg, the incision extended, and a Optiview 5-mm trocar and sleeve were then advanced without difficulty with the laparoscope under direct visualization into the abdomen.    A detailed survey of the patient's pelvis and abdomen revealed the findings as mentioned above.  An additional 25mm trocar was placed in the right lower quadrants under direct visualization, and at 29mm trocar was placed in the opposite quadrant under direct visualization.   Evaluation of the pelvic sidewalls to observe the course of the ureters was undertaken, assuring the ureter was outside the operative field. The omental adhesions were taken down, followed by the epiploica adhesions in the pelvis using the LigaSure electrocautery device, and being careful to stay away from the pelvic sidewalls, the ureter, and the bowels.   Right salping-oopherectomy The right adnexa was elevated away from the pelvic sidewall. Using a Ligasure electrocautery device, the IP ligament was skeletonized, triply clamped, cauterized and transected to remove the ovary and fallopian tube in its entirety, being careful not to spill the contents of the ovary. The fimbria of the left tube were removed, and the left ovary remained in situ.  The operative site was surveyed, and it was found to be hemostatic.  No intraoperative injury to surrounding organs was noted. The fascia of the 10 mm trocar site was closed with 0 Vicryl.   Pictures were taken of the quadrants and pelvis. The abdomen was desufflated and all instruments were then removed from the patient's abdomen. The uterine manipulator was removed without complications.  All incisions were closed with 4-0 Vicryl and Dermabond.   The patient tolerated the procedures well.  All instruments, needles, and sponge counts were correct  x 2. The patient was taken to the recovery room  in stable condition.

## 2017-01-21 NOTE — Transfer of Care (Signed)
Immediate Anesthesia Transfer of Care Note  Patient: Andrea Chapman  Procedure(s) Performed: Procedure(s): LAPAROSCOPY OPERATIVE (N/A) LAPAROSCOPIC Right OOPHORECTOMY (Right) LAPAROSCOPIC LYSIS OF ADHESIONS (N/A) LAPAROSCOPIC BILATERAL SALPINGECTOMY (Bilateral)  Patient Location: PACU  Anesthesia Type:General  Level of Consciousness: awake, alert  and oriented  Airway & Oxygen Therapy: Patient Spontanous Breathing and Patient connected to face mask oxygen  Post-op Assessment: Report given to RN and Post -op Vital signs reviewed and stable  Post vital signs: Reviewed and stable  Last Vitals:  Vitals:   01/21/17 1109 01/21/17 1326  BP: 121/79 129/80  Pulse: 78 84  Resp: 16 16  Temp: (!) 36.2 C (!) 36.2 C    Last Pain:  Vitals:   01/21/17 1326  TempSrc: Temporal  PainSc:          Complications: No apparent anesthesia complications

## 2017-01-21 NOTE — Anesthesia Preprocedure Evaluation (Signed)
Anesthesia Evaluation  Patient identified by MRN, date of birth, ID band Patient awake    Reviewed: Allergy & Precautions, NPO status , Patient's Chart, lab work & pertinent test results  History of Anesthesia Complications (+) PONV and history of anesthetic complications  Airway Mallampati: II  TM Distance: <3 FB Neck ROM: Full    Dental no notable dental hx.    Pulmonary neg sleep apnea, neg COPD, Current Smoker,    breath sounds clear to auscultation- rhonchi (-) wheezing      Cardiovascular Exercise Tolerance: Good (-) hypertension(-) CAD and (-) Past MI  Rhythm:Regular Rate:Normal - Systolic murmurs and - Diastolic murmurs    Neuro/Psych PSYCHIATRIC DISORDERS Anxiety Depression negative neurological ROS     GI/Hepatic negative GI ROS, Neg liver ROS,   Endo/Other  neg diabetesHypothyroidism   Renal/GU negative Renal ROS     Musculoskeletal negative musculoskeletal ROS (+)   Abdominal (+) + obese,   Peds  Hematology  (+) anemia ,   Anesthesia Other Findings Past Medical History: No date: Anemia No date: Anxiety No date: Cancer ([place])     Comment:  melanoma No date: Complication of anesthesia     Comment:  nausea No date: Depression No date: Hypothyroidism   Reproductive/Obstetrics                             Anesthesia Physical Anesthesia Plan  ASA: III  Anesthesia Plan: General   Post-op Pain Management:    Induction: Intravenous  PONV Risk Score and Plan: 2 and Ondansetron, Dexamethasone, Propofol and Scopolamine patch - Pre-op  Airway Management Planned: Oral ETT  Additional Equipment:   Intra-op Plan:   Post-operative Plan: Extubation in OR  Informed Consent: I have reviewed the patients History and Physical, chart, labs and discussed the procedure including the risks, benefits and alternatives for the proposed anesthesia with the patient or authorized  representative who has indicated his/her understanding and acceptance.   Dental advisory given  Plan Discussed with: CRNA and Anesthesiologist  Anesthesia Plan Comments:         Anesthesia Quick Evaluation

## 2017-01-22 ENCOUNTER — Encounter: Payer: Self-pay | Admitting: Obstetrics and Gynecology

## 2017-01-22 LAB — SURGICAL PATHOLOGY

## 2017-03-06 ENCOUNTER — Other Ambulatory Visit: Payer: Self-pay | Admitting: Dermatology

## 2017-03-11 ENCOUNTER — Other Ambulatory Visit: Payer: Self-pay | Admitting: Gastroenterology

## 2017-03-11 DIAGNOSIS — R1013 Epigastric pain: Secondary | ICD-10-CM

## 2017-03-11 DIAGNOSIS — R1011 Right upper quadrant pain: Secondary | ICD-10-CM

## 2017-03-11 DIAGNOSIS — K219 Gastro-esophageal reflux disease without esophagitis: Secondary | ICD-10-CM

## 2017-03-15 ENCOUNTER — Ambulatory Visit: Payer: 59

## 2017-03-22 ENCOUNTER — Ambulatory Visit
Admission: RE | Admit: 2017-03-22 | Discharge: 2017-03-22 | Disposition: A | Discharge status: Home / Self Care | Payer: 59 | Source: Ambulatory Visit | Attending: Gastroenterology | Admitting: Gastroenterology

## 2017-03-22 DIAGNOSIS — R1011 Right upper quadrant pain: Secondary | ICD-10-CM | POA: Diagnosis present

## 2017-03-22 DIAGNOSIS — K219 Gastro-esophageal reflux disease without esophagitis: Secondary | ICD-10-CM

## 2017-03-22 DIAGNOSIS — N281 Cyst of kidney, acquired: Secondary | ICD-10-CM | POA: Diagnosis not present

## 2017-03-22 DIAGNOSIS — R1013 Epigastric pain: Secondary | ICD-10-CM | POA: Diagnosis present

## 2017-03-29 ENCOUNTER — Other Ambulatory Visit: Payer: Self-pay | Admitting: Gastroenterology

## 2017-03-29 DIAGNOSIS — R1013 Epigastric pain: Secondary | ICD-10-CM

## 2017-04-05 ENCOUNTER — Ambulatory Visit
Admission: RE | Admit: 2017-04-05 | Discharge: 2017-04-05 | Disposition: A | Discharge status: Home / Self Care | Payer: 59 | Source: Ambulatory Visit | Attending: Gastroenterology | Admitting: Gastroenterology

## 2017-04-05 DIAGNOSIS — R1013 Epigastric pain: Secondary | ICD-10-CM | POA: Insufficient documentation

## 2017-04-05 DIAGNOSIS — R748 Abnormal levels of other serum enzymes: Secondary | ICD-10-CM | POA: Diagnosis present

## 2017-04-05 MED ORDER — IOPAMIDOL (ISOVUE-300) INJECTION 61%
100.0000 mL | Freq: Once | INTRAVENOUS | Status: AC | PRN
  Administered 2017-04-05: 100 mL via INTRAVENOUS

## 2017-04-18 ENCOUNTER — Other Ambulatory Visit: Payer: Self-pay | Admitting: Dermatology

## 2018-07-08 IMAGING — US US ABDOMEN COMPLETE
1 series · 14 of 25 positions shown · non-contrast
Comparison: CT 08/12/2015.

CLINICAL DATA: Right upper quadrant pain.

EXAM:
ABDOMEN ULTRASOUND COMPLETE

[Series 1: us abdomen complete · 0.22mm/px · 14 of 97 slices shown]
[im 1/97]
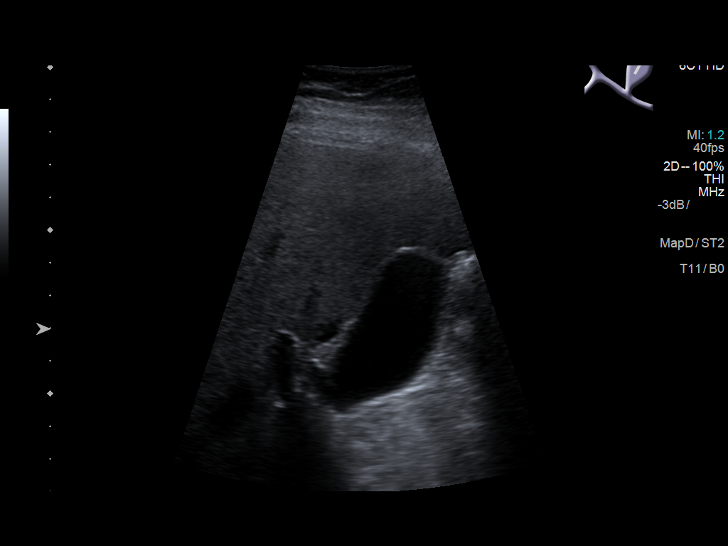
[im 9/97]
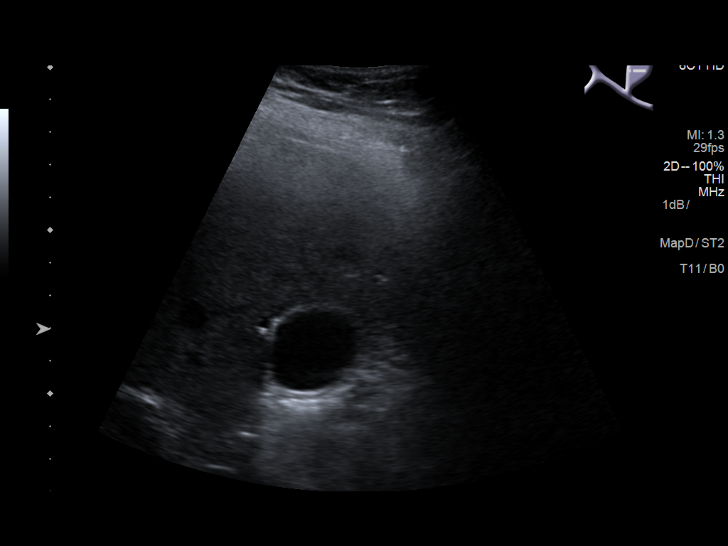
[im 17/97]
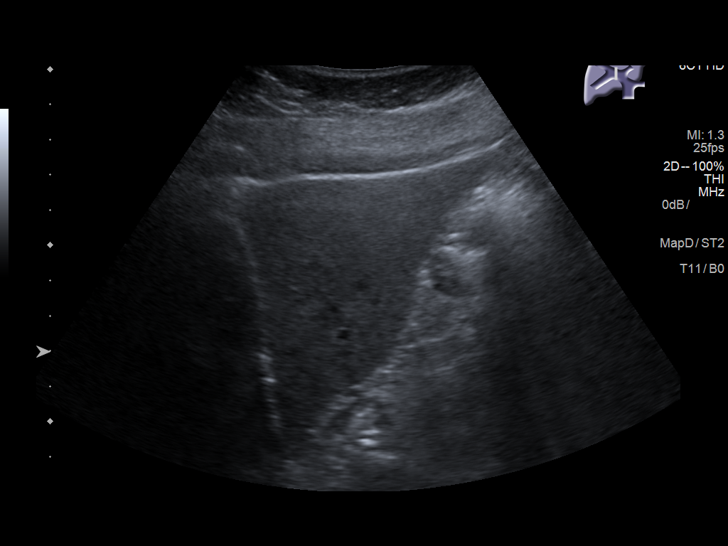
[im 25/97]
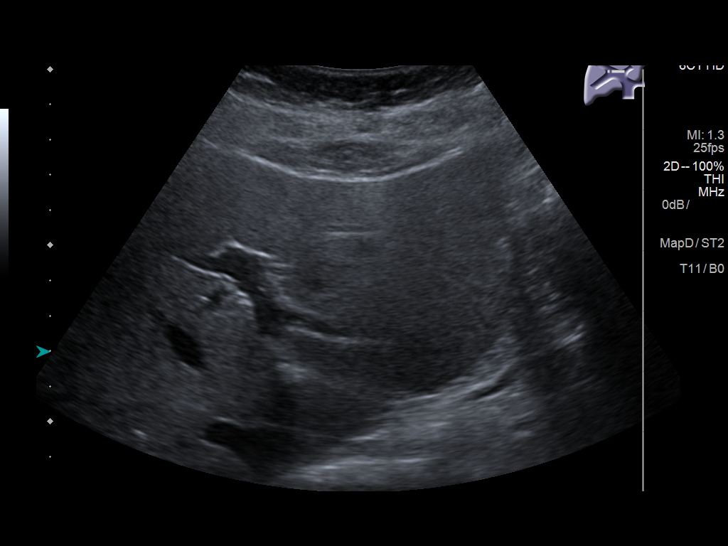
[im 33/97]
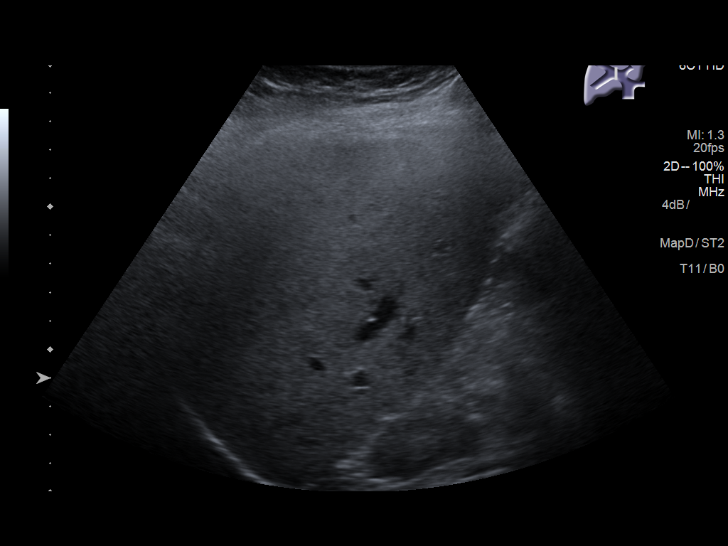
[im 37/97]
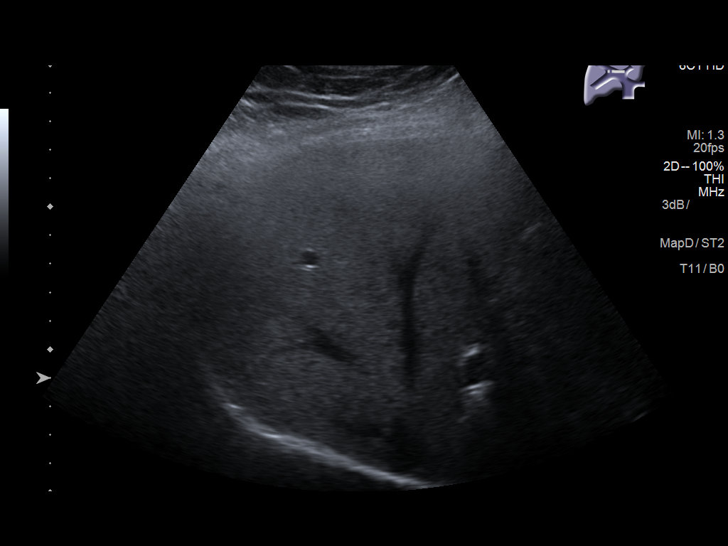
[im 45/97]
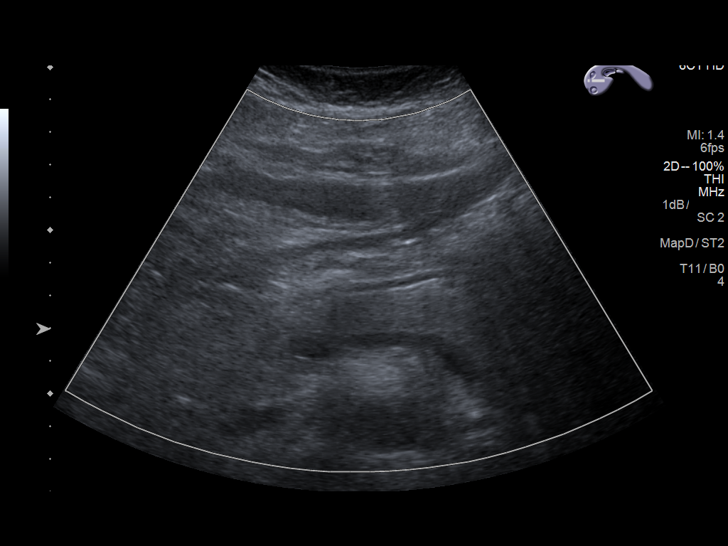
[im 53/97]
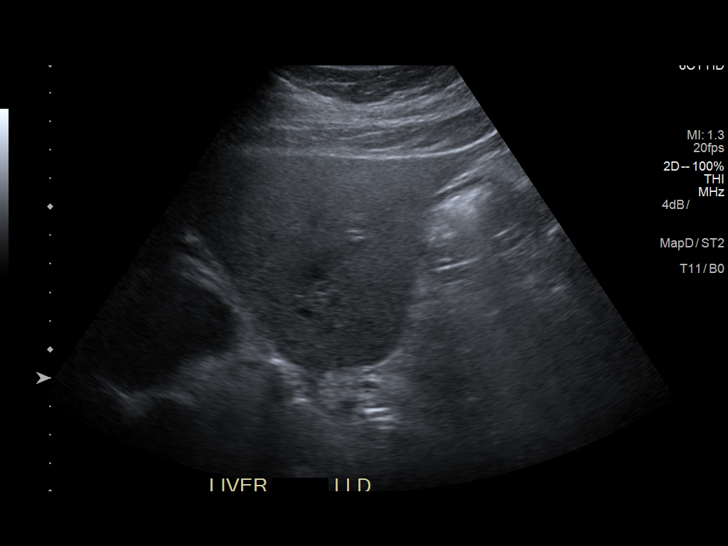
[im 61/97]
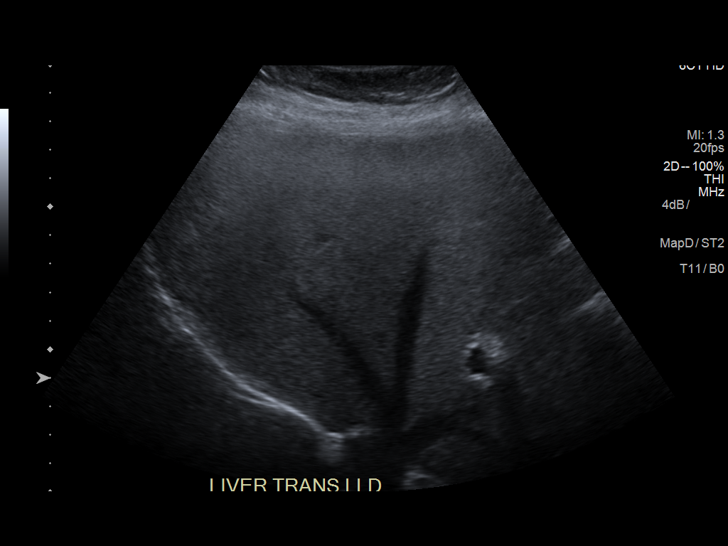
[im 65/97]
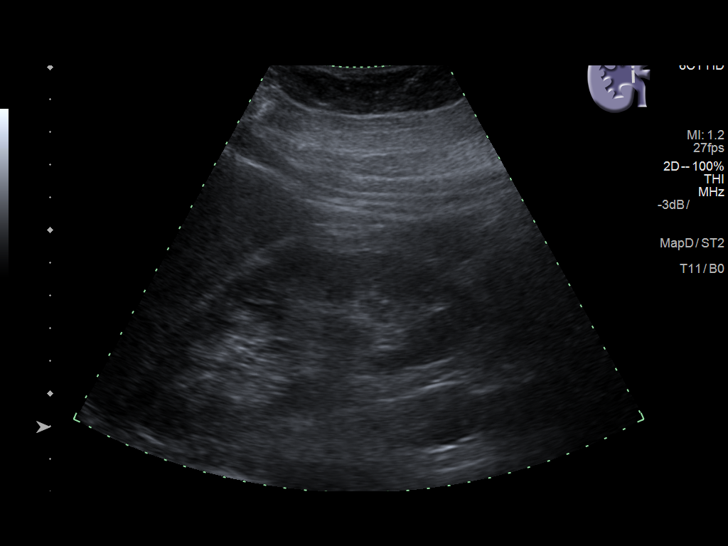
[im 73/97]
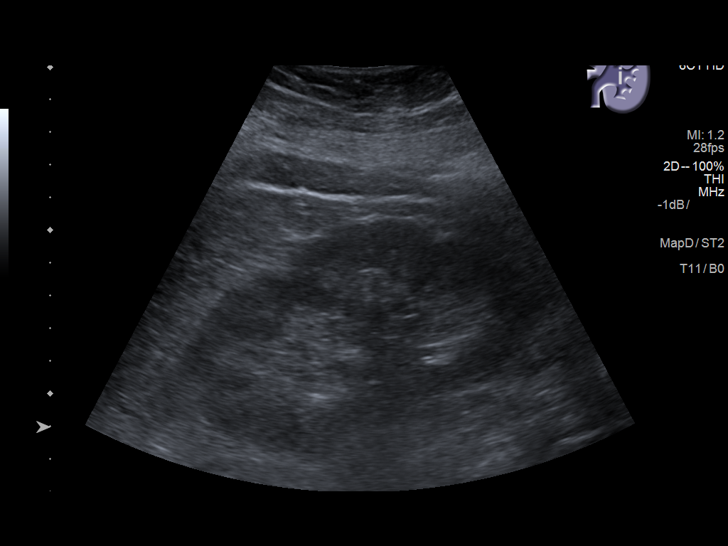
[im 81/97]
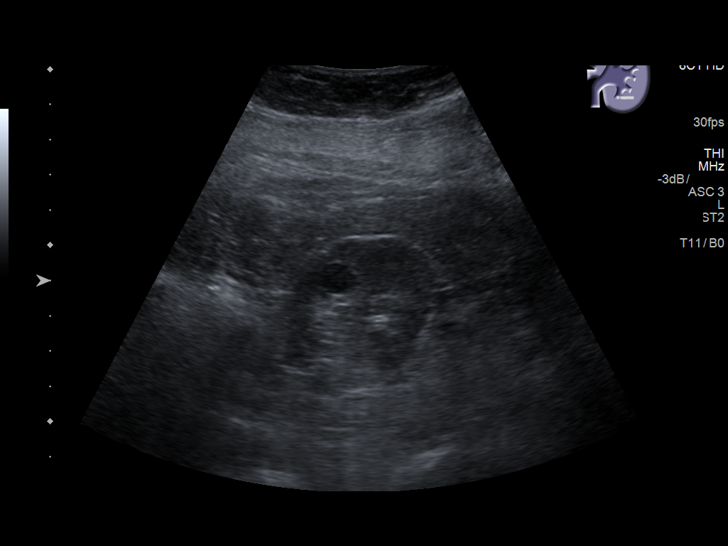
[im 89/97]
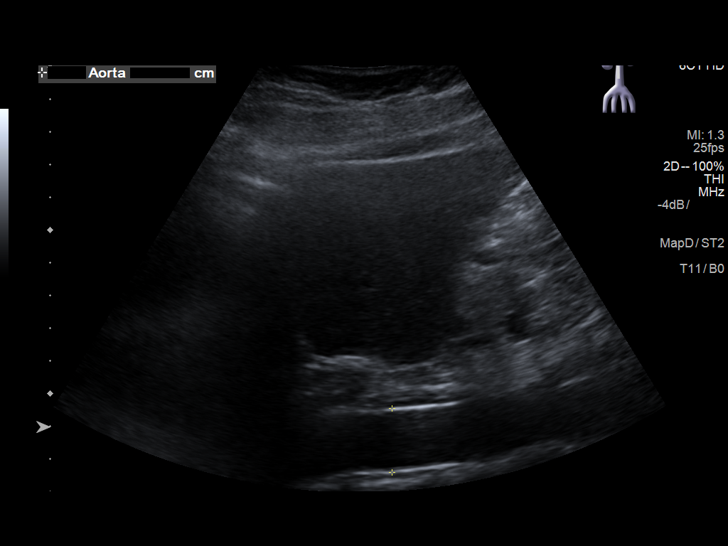
[im 97/97]
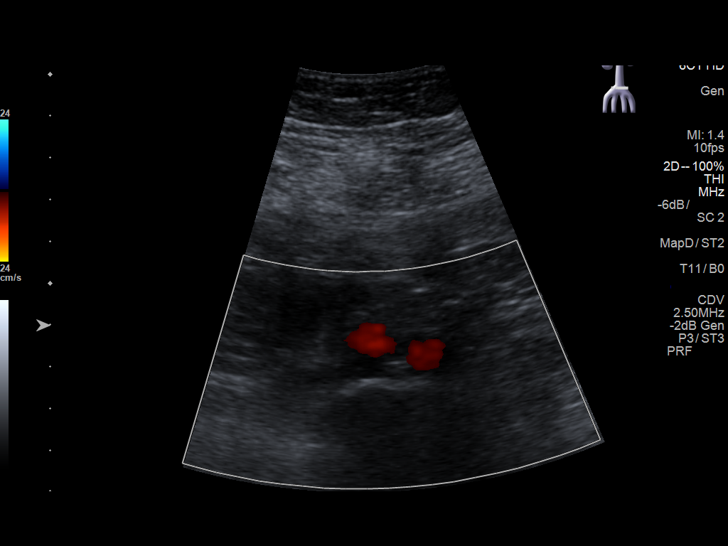

[14 of 25 positions shown; findings below may reference images not displayed]

FINDINGS: Gallbladder: No gallstones or wall thickening visualized. No
sonographic Murphy sign noted by sonographer.

Common bile duct: Diameter: 4.3 mm

Liver: Increased hepatic echogenicity consistent fatty infiltration
and/or hepatocellular disease. Portal vein is patent on color
Doppler imaging with normal direction of blood flow towards the
liver.

IVC: No abnormality visualized.

Pancreas: Visualized portion unremarkable.

Spleen: Size and appearance within normal limits.

Right Kidney: Length: 10.6 cm. Echogenicity within normal limits. No
mass or hydronephrosis visualized.

Left Kidney: Length: 11.0 cm. Echogenicity within normal limits. No
hydronephrosis visualized. 9 mm simple cyst left kidney.

Abdominal aorta: No aneurysm visualized.

Other findings: None.
IMPRESSION: 1.  9 mm simple cyst left kidney.

2. Exam otherwise unremarkable .

## 2019-05-26 ENCOUNTER — Other Ambulatory Visit: Payer: Self-pay | Admitting: Obstetrics and Gynecology

## 2019-05-26 DIAGNOSIS — Z1231 Encounter for screening mammogram for malignant neoplasm of breast: Secondary | ICD-10-CM

## 2019-06-03 ENCOUNTER — Ambulatory Visit
Admission: RE | Admit: 2019-06-03 | Discharge: 2019-06-03 | Disposition: A | Discharge status: Home / Self Care | Payer: BC Managed Care – PPO | Source: Ambulatory Visit | Attending: Obstetrics and Gynecology | Admitting: Obstetrics and Gynecology

## 2019-06-03 ENCOUNTER — Other Ambulatory Visit: Payer: Self-pay

## 2019-06-03 DIAGNOSIS — Z1231 Encounter for screening mammogram for malignant neoplasm of breast: Secondary | ICD-10-CM

## 2020-04-22 ENCOUNTER — Other Ambulatory Visit: Payer: Self-pay | Admitting: Internal Medicine

## 2020-04-22 DIAGNOSIS — Z1231 Encounter for screening mammogram for malignant neoplasm of breast: Secondary | ICD-10-CM

## 2020-08-01 ENCOUNTER — Other Ambulatory Visit: Payer: Self-pay | Admitting: Neurology

## 2020-08-01 DIAGNOSIS — R4189 Other symptoms and signs involving cognitive functions and awareness: Secondary | ICD-10-CM

## 2020-08-01 DIAGNOSIS — R200 Anesthesia of skin: Secondary | ICD-10-CM

## 2020-08-10 ENCOUNTER — Other Ambulatory Visit: Payer: Self-pay

## 2020-08-10 ENCOUNTER — Ambulatory Visit
Admission: RE | Admit: 2020-08-10 | Discharge: 2020-08-10 | Disposition: A | Discharge status: Home / Self Care | Payer: BC Managed Care – PPO | Source: Ambulatory Visit | Attending: Neurology | Admitting: Neurology

## 2020-08-10 DIAGNOSIS — R200 Anesthesia of skin: Secondary | ICD-10-CM

## 2020-08-10 DIAGNOSIS — R202 Paresthesia of skin: Secondary | ICD-10-CM | POA: Diagnosis present

## 2020-08-10 DIAGNOSIS — R4189 Other symptoms and signs involving cognitive functions and awareness: Secondary | ICD-10-CM | POA: Insufficient documentation

## 2020-08-10 MED ORDER — GADOBUTROL 1 MMOL/ML IV SOLN
7.0000 mL | Freq: Once | INTRAVENOUS | Status: AC | PRN
  Administered 2020-08-10: 7 mL via INTRAVENOUS

## 2020-08-23 ENCOUNTER — Other Ambulatory Visit: Payer: Self-pay | Admitting: Internal Medicine

## 2020-08-23 DIAGNOSIS — Z1231 Encounter for screening mammogram for malignant neoplasm of breast: Secondary | ICD-10-CM

## 2020-09-02 ENCOUNTER — Other Ambulatory Visit: Payer: Self-pay | Admitting: Internal Medicine

## 2020-09-02 DIAGNOSIS — E041 Nontoxic single thyroid nodule: Secondary | ICD-10-CM

## 2020-09-14 ENCOUNTER — Ambulatory Visit: Payer: BC Managed Care – PPO

## 2020-09-30 ENCOUNTER — Ambulatory Visit
Admission: RE | Admit: 2020-09-30 | Discharge: 2020-09-30 | Disposition: A | Discharge status: Home / Self Care | Payer: BC Managed Care – PPO | Source: Ambulatory Visit | Attending: Internal Medicine | Admitting: Internal Medicine

## 2020-09-30 ENCOUNTER — Other Ambulatory Visit: Payer: Self-pay

## 2020-09-30 DIAGNOSIS — E041 Nontoxic single thyroid nodule: Secondary | ICD-10-CM | POA: Diagnosis present

## 2021-05-29 LAB — SURGICAL PATHOLOGY

## 2021-10-31 ENCOUNTER — Other Ambulatory Visit: Payer: Self-pay | Admitting: Internal Medicine

## 2021-10-31 DIAGNOSIS — Z1231 Encounter for screening mammogram for malignant neoplasm of breast: Secondary | ICD-10-CM

## 2021-11-16 ENCOUNTER — Other Ambulatory Visit: Payer: Self-pay | Admitting: Student

## 2021-11-16 DIAGNOSIS — M542 Cervicalgia: Secondary | ICD-10-CM

## 2021-11-20 ENCOUNTER — Ambulatory Visit
Admission: RE | Admit: 2021-11-20 | Discharge: 2021-11-20 | Disposition: A | Discharge status: Home / Self Care | Payer: BC Managed Care – PPO | Source: Ambulatory Visit | Attending: Student | Admitting: Student

## 2021-11-20 DIAGNOSIS — M542 Cervicalgia: Secondary | ICD-10-CM

## 2021-11-21 ENCOUNTER — Other Ambulatory Visit: Payer: Self-pay | Admitting: Student

## 2021-11-21 DIAGNOSIS — M545 Low back pain, unspecified: Secondary | ICD-10-CM

## 2021-11-28 ENCOUNTER — Ambulatory Visit
Admission: RE | Admit: 2021-11-28 | Discharge: 2021-11-28 | Disposition: A | Discharge status: Home / Self Care | Payer: BC Managed Care – PPO | Source: Ambulatory Visit | Attending: Student | Admitting: Student

## 2021-11-28 DIAGNOSIS — M545 Low back pain, unspecified: Secondary | ICD-10-CM

## 2021-11-30 ENCOUNTER — Ambulatory Visit
Admission: RE | Admit: 2021-11-30 | Discharge: 2021-11-30 | Disposition: A | Discharge status: Home / Self Care | Payer: BC Managed Care – PPO | Source: Ambulatory Visit | Attending: Internal Medicine | Admitting: Internal Medicine

## 2021-11-30 DIAGNOSIS — Z1231 Encounter for screening mammogram for malignant neoplasm of breast: Secondary | ICD-10-CM | POA: Diagnosis present

## 2021-12-27 ENCOUNTER — Other Ambulatory Visit: Payer: Self-pay | Admitting: Family Medicine

## 2021-12-27 DIAGNOSIS — M5412 Radiculopathy, cervical region: Secondary | ICD-10-CM

## 2022-01-03 ENCOUNTER — Ambulatory Visit
Admission: RE | Admit: 2022-01-03 | Discharge: 2022-01-03 | Disposition: A | Discharge status: Home / Self Care | Payer: BC Managed Care – PPO | Source: Ambulatory Visit | Attending: Family Medicine | Admitting: Family Medicine

## 2022-01-03 DIAGNOSIS — M5412 Radiculopathy, cervical region: Secondary | ICD-10-CM

## 2022-01-03 MED ORDER — TRIAMCINOLONE ACETONIDE 40 MG/ML IJ SUSP (RADIOLOGY)
60.0000 mg | Freq: Once | INTRAMUSCULAR | Status: AC
  Administered 2022-01-03: 60 mg via EPIDURAL

## 2022-01-03 MED ORDER — IOPAMIDOL (ISOVUE-M 300) INJECTION 61%
1.0000 mL | Freq: Once | INTRAMUSCULAR | Status: AC | PRN
  Administered 2022-01-03: 1 mL via EPIDURAL

## 2022-01-03 NOTE — Discharge Instructions (Signed)

## 2022-08-15 ENCOUNTER — Encounter (INDEPENDENT_AMBULATORY_CARE_PROVIDER_SITE_OTHER): Payer: BC Managed Care – PPO

## 2022-08-15 ENCOUNTER — Other Ambulatory Visit: Payer: Self-pay | Admitting: Gastroenterology

## 2022-08-15 DIAGNOSIS — Z86010 Personal history of colonic polyps: Secondary | ICD-10-CM

## 2022-08-15 DIAGNOSIS — D124 Benign neoplasm of descending colon: Secondary | ICD-10-CM

## 2022-08-15 DIAGNOSIS — Z1211 Encounter for screening for malignant neoplasm of colon: Secondary | ICD-10-CM | POA: Diagnosis present

## 2022-08-15 DIAGNOSIS — D128 Benign neoplasm of rectum: Secondary | ICD-10-CM | POA: Diagnosis not present

## 2022-08-15 DIAGNOSIS — K5730 Diverticulosis of large intestine without perforation or abscess without bleeding: Secondary | ICD-10-CM

## 2022-08-17 LAB — SURGICAL PATHOLOGY

## 2023-02-01 ENCOUNTER — Other Ambulatory Visit: Payer: Self-pay | Admitting: Internal Medicine

## 2023-02-01 DIAGNOSIS — Z1231 Encounter for screening mammogram for malignant neoplasm of breast: Secondary | ICD-10-CM

## 2023-02-08 ENCOUNTER — Ambulatory Visit
Admission: RE | Admit: 2023-02-08 | Discharge: 2023-02-08 | Disposition: A | Discharge status: Home / Self Care | Payer: BC Managed Care – PPO | Source: Ambulatory Visit | Attending: Internal Medicine | Admitting: Internal Medicine

## 2023-02-08 DIAGNOSIS — Z1231 Encounter for screening mammogram for malignant neoplasm of breast: Secondary | ICD-10-CM | POA: Diagnosis not present

## 2023-08-19 ENCOUNTER — Encounter: Payer: Self-pay | Admitting: Family Medicine

## 2023-08-19 ENCOUNTER — Other Ambulatory Visit: Payer: Self-pay | Admitting: Family Medicine

## 2023-08-19 DIAGNOSIS — M5412 Radiculopathy, cervical region: Secondary | ICD-10-CM

## 2023-08-21 ENCOUNTER — Ambulatory Visit
Admission: RE | Admit: 2023-08-21 | Discharge: 2023-08-21 | Disposition: A | Discharge status: Home / Self Care | Payer: BC Managed Care – PPO | Source: Ambulatory Visit | Attending: Family Medicine | Admitting: Family Medicine

## 2023-08-21 DIAGNOSIS — M5412 Radiculopathy, cervical region: Secondary | ICD-10-CM

## 2023-08-21 MED ORDER — TRIAMCINOLONE ACETONIDE 40 MG/ML IJ SUSP (RADIOLOGY)
60.0000 mg | Freq: Once | INTRAMUSCULAR | Status: AC
  Administered 2023-08-21: 60 mg via EPIDURAL

## 2023-08-21 MED ORDER — IOPAMIDOL (ISOVUE-M 300) INJECTION 61%
1.0000 mL | Freq: Once | INTRAMUSCULAR | Status: AC | PRN
  Administered 2023-08-21: 1 mL via EPIDURAL

## 2023-08-21 NOTE — Discharge Instructions (Signed)

## 2023-08-28 IMAGING — MG MM DIGITAL SCREENING BILAT W/ TOMO AND CAD
6 of 12 series · 6 of 36 positions shown · non-contrast
Comparison: Previous exam(s).

CLINICAL DATA: Screening.

EXAM:
DIGITAL SCREENING BILATERAL MAMMOGRAM WITH TOMOSYNTHESIS AND CAD
TECHNIQUE: Bilateral screening digital craniocaudal and mediolateral oblique
mammograms were obtained. Bilateral screening digital breast
tomosynthesis was performed. The images were evaluated with
computer-aided detection.

[R CC synth-2D (1 of 2)]
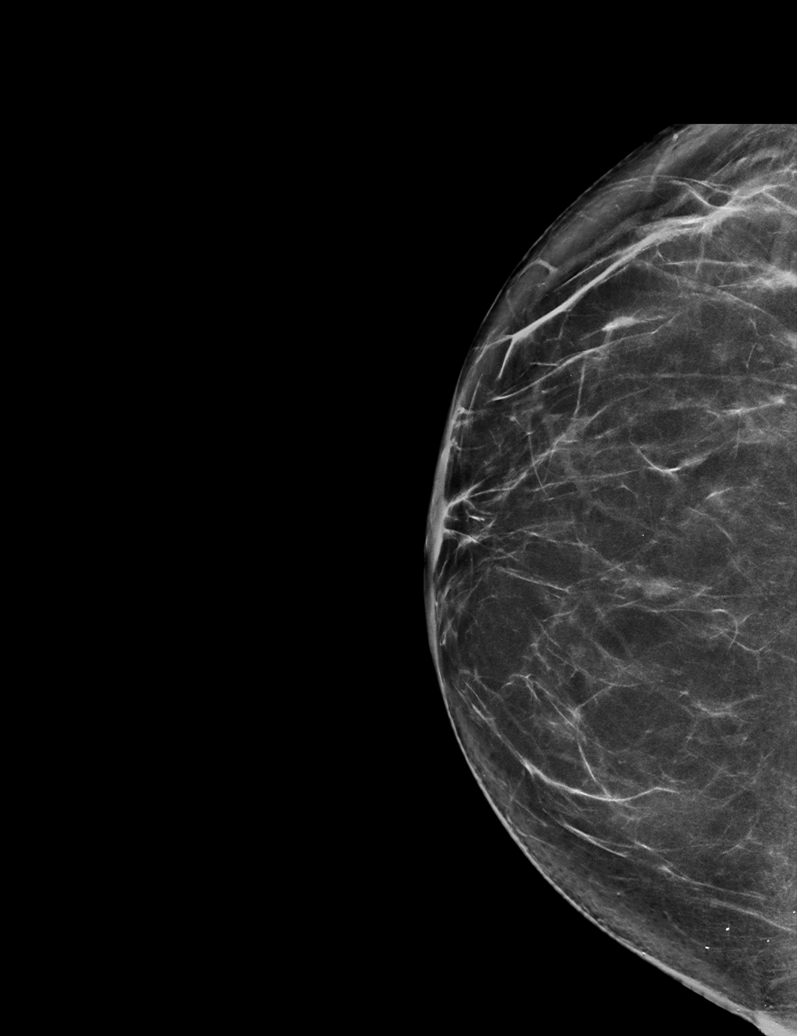

[L MLO synth-2D]
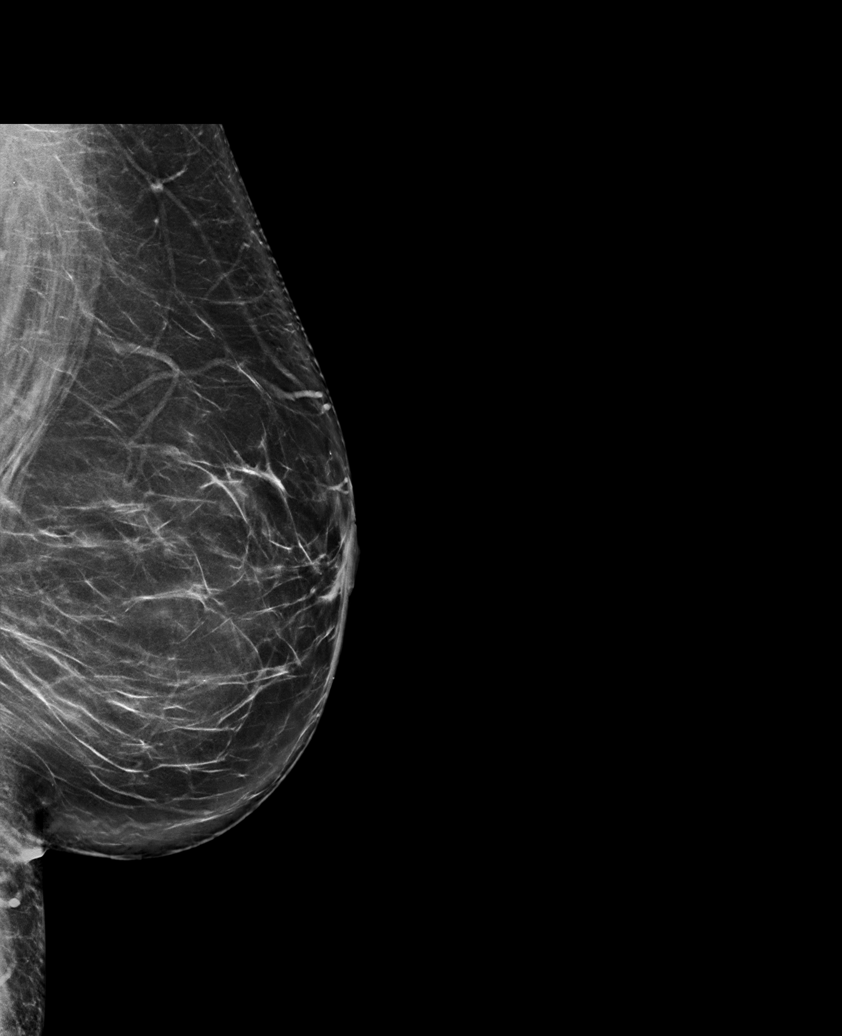

[R CC synth-2D (2 of 2)]
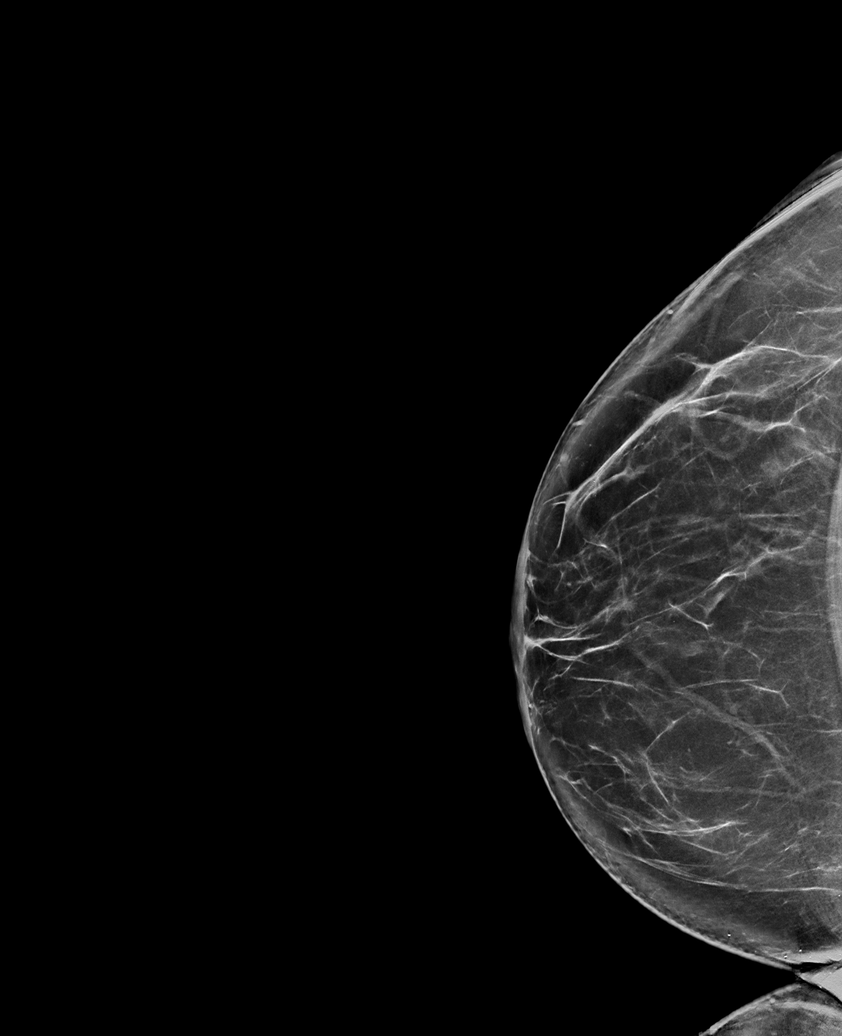

[L CC synth-2D (1 of 2)]
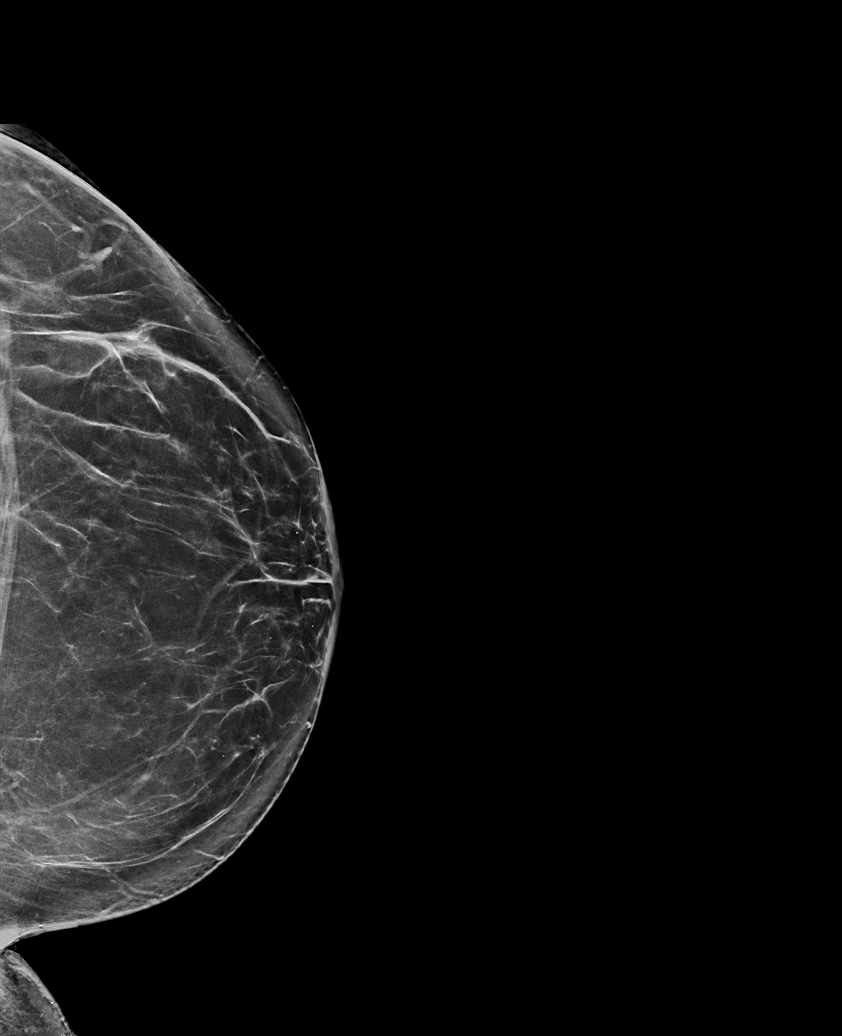

[R MLO synth-2D]
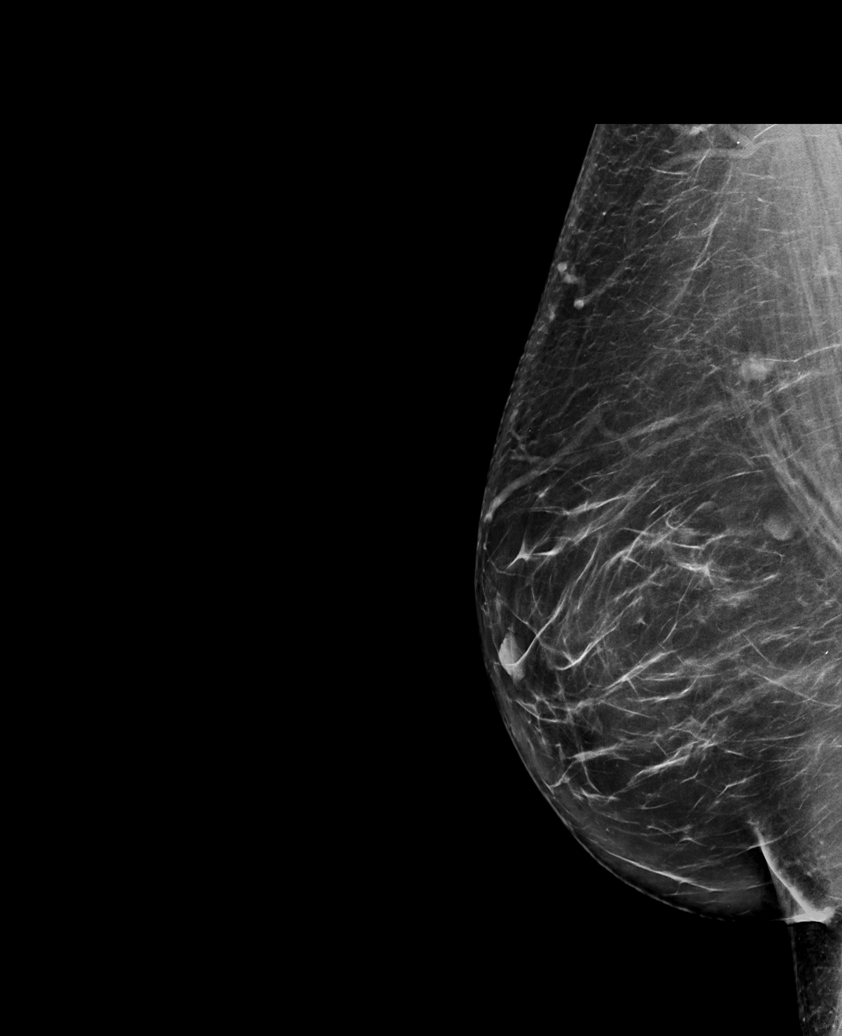

[L CC synth-2D (2 of 2)]
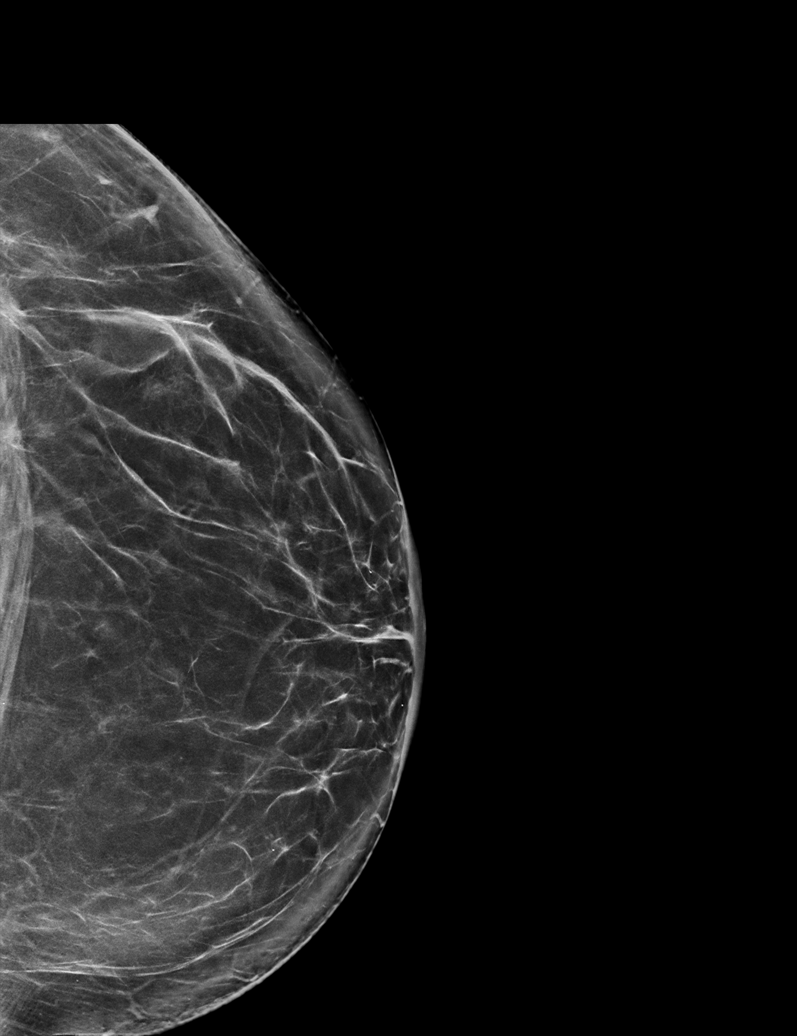

[6 of 36 positions shown; findings below may reference images not displayed]

ACR Breast Density Category b: There are scattered areas of
fibroglandular density.
FINDINGS: There are no findings suspicious for malignancy.
IMPRESSION: No mammographic evidence of malignancy. A result letter of this
screening mammogram will be mailed directly to the patient.

RECOMMENDATION:
Screening mammogram in one year. (Code:51-O-LD2)

BI-RADS CATEGORY  1: Negative.

## 2023-09-13 ENCOUNTER — Other Ambulatory Visit: Payer: Self-pay | Admitting: Family Medicine

## 2023-09-13 DIAGNOSIS — M5412 Radiculopathy, cervical region: Secondary | ICD-10-CM

## 2023-09-30 ENCOUNTER — Ambulatory Visit
Admission: RE | Admit: 2023-09-30 | Discharge: 2023-09-30 | Disposition: A | Discharge status: Home / Self Care | Source: Ambulatory Visit | Attending: Family Medicine | Admitting: Family Medicine

## 2023-09-30 DIAGNOSIS — M5412 Radiculopathy, cervical region: Secondary | ICD-10-CM

## 2023-10-09 ENCOUNTER — Encounter: Payer: Self-pay | Admitting: Family Medicine

## 2023-10-10 ENCOUNTER — Other Ambulatory Visit: Payer: Self-pay | Admitting: Family Medicine

## 2023-10-10 DIAGNOSIS — M5412 Radiculopathy, cervical region: Secondary | ICD-10-CM

## 2023-10-21 NOTE — Discharge Instructions (Signed)
 Post Procedure Spinal Discharge Instruction Sheet  You may resume a regular diet and any medications that you routinely take (including pain medications) unless otherwise noted by MD.  No driving day of procedure.  Light activity throughout the rest of the day.  Do not do any strenuous work, exercise, bending or lifting.  The day following the procedure, you can resume normal physical activity but you should refrain from exercising or physical therapy for at least three days thereafter.  You may apply ice to the injection site, 20 minutes on, 20 minutes off, as needed. Do not apply ice directly to skin.    Common Side Effects:  Headaches- take your usual medications as directed by your physician.  Increase your fluid intake.  Caffeinated beverages may be helpful.  Lie flat in bed until your headache resolves.  Restlessness or inability to sleep- you may have trouble sleeping for the next few days.  Ask your referring physician if you need any medication for sleep.  Facial flushing or redness- should subside within a few days.  Increased pain- a temporary increase in pain a day or two following your procedure is not unusual.  Take your pain medication as prescribed by your referring physician.  Leg cramps  Please contact our office at [phone] for the following symptoms: Fever greater than 100 degrees. Headaches unresolved with medication after 2-3 days. Increased swelling, pain, or redness at injection site.   Thank you for visiting [facility] today!Post Procedure Spinal Discharge Instruction Sheet  You may resume a regular diet and any medications that you routinely take (including pain medications) unless otherwise noted by MD.  No driving day of procedure.  Light activity throughout the rest of the day.  Do not do any strenuous work, exercise, bending or lifting.  The day following the procedure, you can resume normal physical activity but you should refrain from  exercising or physical therapy for at least three days thereafter.  You may apply ice to the injection site, 20 minutes on, 20 minutes off, as needed. Do not apply ice directly to skin.    Common Side Effects:  Headaches- take your usual medications as directed by your physician.  Increase your fluid intake.  Caffeinated beverages may be helpful.  Lie flat in bed until your headache resolves.  Restlessness or inability to sleep- you may have trouble sleeping for the next few days.  Ask your referring physician if you need any medication for sleep.  Facial flushing or redness- should subside within a few days.  Increased pain- a temporary increase in pain a day or two following your procedure is not unusual.  Take your pain medication as prescribed by your referring physician.  Leg cramps  Please contact our office at [phone] for the following symptoms: Fever greater than 100 degrees. Headaches unresolved with medication after 2-3 days. Increased swelling, pain, or redness at injection site.   Thank you for visiting [facility]  today!

## 2023-10-22 ENCOUNTER — Ambulatory Visit
Admission: RE | Admit: 2023-10-22 | Discharge: 2023-10-22 | Discharge status: Home / Self Care | Source: Ambulatory Visit | Attending: Family Medicine | Admitting: Family Medicine

## 2023-10-22 DIAGNOSIS — M5412 Radiculopathy, cervical region: Secondary | ICD-10-CM

## 2023-10-22 MED ORDER — IOPAMIDOL (ISOVUE-M 300) INJECTION 61%
3.0000 mL | Freq: Once | INTRAMUSCULAR | Status: AC | PRN
  Administered 2023-10-22: 3 mL via EPIDURAL

## 2023-10-22 MED ORDER — TRIAMCINOLONE ACETONIDE 40 MG/ML IJ SUSP (RADIOLOGY)
60.0000 mg | Freq: Once | INTRAMUSCULAR | Status: AC
  Administered 2023-10-22: 60 mg via EPIDURAL

## 2023-10-22 MED ORDER — LIDOCAINE 1 % OPTIME INJ - NO CHARGE
5.0000 mL | Freq: Once | INTRAMUSCULAR | Status: AC
  Administered 2023-10-22: 5 mL via INTRADERMAL

## 2023-12-10 NOTE — Progress Notes (Signed)
 Referring Physician:  Dodson Delon FERNS, MD [address] [place],  [place] [address]  Primary Physician:  Sadie Manna, MD  History of Present Illness: 12/10/2023 Andrea Chapman is here today with a chief complaint of neck and right arm pain.  She has been dealing with the symptoms for a significant amount of time.  She has been having multiple injections and fortunately does get relief however this wears away.  She has [facility] physical [facility] with [facility].  Conservative measures:  Physical therapy:  currently participating in at [facility].  Multimodal medical therapy including regular antiinflammatories:  tylenol , advil , gabapentin   Injections:   10/22/2023: Right C6-7 interlaminar ESI (90% relief) 08/21/2023: Right C7-T1 interlaminar ESI (50% relief of pain continued numbness and tingling) 07/12/2022: right subacromial injection (good relief) 01/03/2022: Right C7-T1 interlaminar epidural steroid injection performed at [facility] imaging (90% relief)   Past Surgery: no spinal surgeries   The symptoms are causing a significant impact on the patient's life.   I have utilized the care everywhere function in epic to review the outside records available from external health systems.  Review of Systems:  A 10 point review of systems is negative, except for the pertinent positives and negatives detailed in the HPI.  Past Medical History: Past Medical History:  Diagnosis Date   Anemia    Anxiety    Cancer (HCC)    melanoma   Complication of anesthesia    nausea   Depression    Hypothyroidism     Past Surgical History: Past Surgical History:  Procedure Laterality Date   ABDOMINAL HYSTERECTOMY     abdominal plasty     CESAREAN SECTION     LAPAROSCOPIC BILATERAL SALPINGECTOMY Bilateral 01/21/2017   Procedure: LAPAROSCOPIC BILATERAL SALPINGECTOMY;  Surgeon: Verdon Keen, MD;  Location: [facility] ORS;  Service: Gynecology;  Laterality: Bilateral;   LAPAROSCOPIC  LYSIS OF ADHESIONS N/A 01/21/2017   Procedure: LAPAROSCOPIC LYSIS OF ADHESIONS;  Surgeon: Verdon Keen, MD;  Location: [facility] ORS;  Service: Gynecology;  Laterality: N/A;   LAPAROSCOPY N/A 01/21/2017   Procedure: LAPAROSCOPY OPERATIVE;  Surgeon: Verdon Keen, MD;  Location: [facility] ORS;  Service: Gynecology;  Laterality: N/A;   REDUCTION MAMMAPLASTY Bilateral 2005   mastopexy   TONSILLECTOMY     TUBAL LIGATION      Allergies: Allergies as of 12/11/2023 - Review Complete 08/21/2023  Allergen Reaction Noted   Azithromycin Other (See Comments) 07/13/2015   Metoclopramide Itching and Other (See Comments) 07/31/2014   Morphine Itching and Other (See Comments) 07/31/2014    Medications:  Current Outpatient Medications:    clonazePAM (KLONOPIN) 1 MG tablet, Take 1 mg by mouth daily as needed for anxiety., Disp: , Rfl:    cyanocobalamin (,VITAMIN B-12,) 1000 MCG/ML injection, Inject 1,000 mcg into the skin every 30 (thirty) days., Disp: , Rfl:    docusate sodium  (COLACE) 100 MG capsule, Take 1 capsule (100 mg total) by mouth 2 (two) times daily. To keep stools soft, Disp: 30 capsule, Rfl: 0   fluticasone (FLONASE) 50 MCG/ACT nasal spray, Place 2 sprays into both nostrils daily as needed for allergies or rhinitis., Disp: , Rfl:    gabapentin  (NEURONTIN ) 800 MG tablet, Take 1 tablet (800 mg total) by mouth at bedtime., Disp: 3 tablet, Rfl: 0   ibuprofen  (ADVIL ,MOTRIN ) 800 MG tablet, Take 1 tablet (800 mg total) by mouth every 8 (eight) hours as needed for moderate pain., Disp: 30 tablet, Rfl: 1   levothyroxine (SYNTHROID, LEVOTHROID) 175 MCG tablet, Take  175 mcg by mouth daily., Disp: , Rfl:    oxycodone  (OXY-IR) 5 MG capsule, Take 1 capsule (5 mg total) by mouth every 6 (six) hours as needed for pain., Disp: 15 capsule, Rfl: 0   venlafaxine (EFFEXOR) 100 MG tablet, Take 100 mg by mouth 3 (three) times daily., Disp: , Rfl:   Social History: Social History   Tobacco Use   Smoking status:  Some Days    Current packs/day: 1.50    Types: Cigarettes   Smokeless tobacco: Never  Vaping Use   Vaping status: Never Used  Substance Use Topics   Alcohol use: Yes    Alcohol/week: 2.0 standard drinks of alcohol    Types: 2 Glasses of wine per week   Drug use: No    Family Medical History: Family History  Problem Relation Age of Onset   Breast cancer Sister 38   Breast cancer Maternal Aunt 55   Breast cancer Maternal Aunt 60    Physical Examination: There were no vitals filed for this visit.   NEUROLOGICAL:     Awake, alert, oriented to person, place, and time.  Speech is clear and fluent.   Strength: Side Biceps Triceps Deltoid Interossei Grip Wrist Ext. Wrist Flex.  R 5 5 5 5 5 5 5   L 5 5 5 5 5 5 5     Reflexes are slight asymmetry noted in the reflexes on the right compared to the left.  Slight change in sensation in the right side upper extremity compared to the left     No evidence of dysmetria noted.  Gait is normal.    Imaging: Narrative & Impression  CLINICAL DATA:  Initial evaluation for numbness extending from the right neck into the right arm and fingers for 6 weeks.   EXAM: MRI CERVICAL SPINE WITHOUT CONTRAST   TECHNIQUE: Multiplanar, multisequence MR imaging of the cervical spine was performed. No intravenous contrast was administered.   COMPARISON:  Prior MRI from 11/20/2021   FINDINGS: Alignment: Straightening with slight reversal of the normal cervical lordosis. No listhesis.   Vertebrae: Vertebral body height maintained without acute or chronic fracture. Bone marrow signal intensity within normal limits. Few small benign hemangiomata noted. No worrisome osseous lesions. No abnormal marrow edema.   Cord: Normal signal and morphology.   Posterior Fossa, vertebral arteries, paraspinal tissues: Visualized brain and posterior fossa within normal limits. Craniocervical junction normal. Paraspinous soft tissues within normal  limits. Normal flow voids seen within the vertebral arteries bilaterally. Multiple nodules present within the right thyroid  lobe, largest of which measures up to 2.3 cm.   Disc levels:   C2-C3: Unremarkable.   C3-C4: Small central to right paracentral disc protrusion indents the ventral thecal sac (series 110, image 10). Minimal flattening of the ventral cord without cord signal changes or significant spinal stenosis. Superimposed uncovertebral spurring with mild left C4 foraminal stenosis. Right neural foramen remains patent. Appearance is relatively stable.   C4-C5: Diffuse disc bulge with bilateral uncovertebral spurring, slightly worse on the right. Flattening and partial effacement of the ventral thecal sac with mild cord flattening but no cord signal changes. Mild spinal stenosis. Mild to moderate right with mild left C5 foraminal stenosis. Appearance is similar.   C5-C6: Diffuse disc bulge with bilateral uncovertebral spurring. Flattening and partial effacement of the ventral thecal sac with resultant mild spinal stenosis. Mild left with mild-to-moderate right C6 foraminal narrowing. Appearance is similar.   C6-C7: Diffuse disc bulge with bilateral uncovertebral spurring. Superimposed right foraminal  disc protrusion, slightly increased in size in prominence from prior (series 109, image 24). Flattening and partial effacement of the ventral thecal sac with resultant mild right-sided spinal stenosis. Moderate right C7 foraminal narrowing. Left neural foramina remains patent.   C7-T1: Irregular central to right paracentral disc protrusion (series 109, images 30, 31). No significant spinal stenosis. Foramina remain patent. Appearance is similar.   IMPRESSION: 1. Right foraminal disc protrusion at C6-7, slightly increased in size and prominence from prior, which could contribute to right-sided radicular symptoms. Query right C7 radiculitis. 2. Additional multilevel  cervical spondylosis with resultant mild diffuse spinal stenosis at C4-5 through C6-7. Mild to moderate right C5 through C7 foraminal stenosis as above. Appearance is otherwise relatively similar to prior. 3. Multiple nodules within the right thyroid  lobe, largest of which measures up to 2.3 cm. These have been previously evaluated by thyroid  ultrasound on 09/30/2020. Please refer to this examination regarding any potential follow-up recommendations regarding this finding.     Electronically Signed   By: Morene Hoard M.D.   On: 10/08/2023 04:55   I have personally reviewed the images and agree with the above interpretation.  Medical Decision Making/Assessment and Plan: Andrea Chapman is a pleasant 48 y.o. female with a right sided cervical radiculopathy.  She has been followed with the team at [facility] for injections.  She has gotten better relief.  She is good strength in her bilateral upper extremities.  She has some sensory disturbances and some slight reflex asymmetry.  Her MRI does demonstrate multilevel spondylosis right worse than left.  Will plan on getting a cervical flexion-extension x-ray today to ensure that she does not have any instability.  At this point given her good pain control we will continue to follow conservatively.  Thank you for involving me in the care of this patient.    Penne MICAEL Sharps MD/MSCR Neurosurgery   I spent a total of 30 minutes with the patient, reviewing her imaging, going over her physical examination and presentation.  And with care coordination going forward.

## 2023-12-11 ENCOUNTER — Ambulatory Visit
Admission: RE | Admit: 2023-12-11 | Discharge: 2023-12-11 | Disposition: A | Discharge status: Home / Self Care | Source: Ambulatory Visit | Attending: Neurosurgery | Admitting: Neurosurgery

## 2023-12-11 ENCOUNTER — Encounter: Payer: Self-pay | Admitting: Neurosurgery

## 2023-12-11 ENCOUNTER — Ambulatory Visit: Admitting: Neurosurgery

## 2023-12-11 ENCOUNTER — Ambulatory Visit
Admission: RE | Admit: 2023-12-11 | Discharge: 2023-12-11 | Disposition: A | Discharge status: Home / Self Care | Attending: Neurosurgery | Admitting: Neurosurgery

## 2023-12-11 VITALS — BP 136/88 | Ht 64.0 in | Wt 183.0 lb

## 2023-12-11 DIAGNOSIS — M5412 Radiculopathy, cervical region: Secondary | ICD-10-CM

## 2023-12-11 DIAGNOSIS — M5010 Cervical disc disorder with radiculopathy, unspecified cervical region: Secondary | ICD-10-CM

## 2023-12-11 MED ORDER — METHYLPREDNISOLONE 4 MG PO TBPK: ORAL_TABLET | ORAL | 0 refills | Status: DC

## 2024-01-01 ENCOUNTER — Telehealth: Payer: Self-pay | Admitting: Neurosurgery

## 2024-01-01 NOTE — Telephone Encounter (Signed)
 Patient called to follow up on her x-ray results and to let our office know she would like to proceed with scheduling surgery.

## 2024-01-01 NOTE — Telephone Encounter (Signed)
 I spoke with the patient to confirm that she would like to move forward with surgery. I scheduled her to see Dr Claudene on 7/7 to discuss.

## 2024-01-06 ENCOUNTER — Other Ambulatory Visit: Payer: Self-pay

## 2024-01-06 ENCOUNTER — Encounter: Payer: Self-pay | Admitting: Neurosurgery

## 2024-01-06 ENCOUNTER — Ambulatory Visit: Admitting: Neurosurgery

## 2024-01-06 VITALS — BP 126/78 | Ht 64.0 in | Wt 183.0 lb

## 2024-01-06 DIAGNOSIS — M40202 Unspecified kyphosis, cervical region: Secondary | ICD-10-CM

## 2024-01-06 DIAGNOSIS — M4802 Spinal stenosis, cervical region: Secondary | ICD-10-CM

## 2024-01-06 DIAGNOSIS — Z01818 Encounter for other preprocedural examination: Secondary | ICD-10-CM

## 2024-01-06 DIAGNOSIS — M5010 Cervical disc disorder with radiculopathy, unspecified cervical region: Secondary | ICD-10-CM | POA: Insufficient documentation

## 2024-01-06 DIAGNOSIS — M4722 Other spondylosis with radiculopathy, cervical region: Secondary | ICD-10-CM | POA: Diagnosis not present

## 2024-01-06 NOTE — Patient Instructions (Signed)
 Please see below for information in regards to your upcoming surgery:   Planned surgery: C5-7 arthroplasty   Surgery date: 01/23/24 at [facility]: [address], [place], [place] [address]) - you will find out your arrival time the business day before your surgery.   Pre-op appointment at [facility] Pre-admit Testing: you will receive a call with a date/time for this appointment. If you are scheduled for an in person appointment, Pre-admit Testing is located on the first floor of the [facility] building, [address], [address]. During this appointment, they will advise you which medications you can take the morning of surgery, and which medications you will need to hold for surgery. Labs (such as blood work, EKG) may be done at your pre-op appointment. You are not required to fast for these labs. Should you need to change your pre-op appointment, please call Pre-admit testing at [phone].    Common restrictions after surgery: No bending, lifting, or twisting ("BLT"). Avoid lifting objects heavier than 10 pounds for the first 6 weeks after surgery. Where possible, avoid household activities that involve lifting, bending, reaching, pushing, or pulling such as laundry, vacuuming, grocery shopping, and childcare. Try to arrange for help from friends and family for these activities while you heal. Do not drive while taking prescription pain medication. Weeks 6 through 12 after surgery: avoid lifting more than 25 pounds.    X-rays after surgery: Because you are having an arthroplasty: for appointments after your 2 week follow-up: please arrive at the [facility] outpatient imaging center ([address], [place]) or [facility] one hour prior to your appointment for x-rays. This applies to every appointment after your 2 week follow-up. Failure to do so may result in your appointment being  rescheduled.   How to contact us :  If you have any questions/concerns before or after surgery, you can reach us  at [phone], or you can send a mychart message. We can be reached by phone or mychart 8am-4pm, Monday-Friday.  *Please note: Calls after 4pm are forwarded to a third party answering service. Mychart messages are not routinely monitored during evenings, weekends, and holidays. Please call our office to contact the answering service for urgent concerns during non-business hours.   If you have FMLA/disability paperwork, please drop it off or fax it to [phone]   Appointments/FMLA & disability paperwork: Odetta Mora, & Ritta Registered Nurses/Surgery schedulers: Daton Szilagyi & Lauren Medical Assistants: Damien ODESSIA Sailors Physician Assistants: Lyle Decamp, PA-C, Edsel Goods, PA-C & Glade Boys, PA-C Surgeons: Reeves Daisy, MD & Penne Sharps, MD   [facility] PREADMIT TESTING VISIT and SURGERY INFORMATION SHEET   Now that surgery has been scheduled you can anticipate several phone calls from [facility] services. A pharmacy technician will call you to verify your current list of medications taken at home.               The Pre-Service Center will call to verify your insurance information and to give you billing estimates and information.             The Preadmit Testing Office will be calling to schedule a visit to obtain information for the anesthesia team and provide instructions on preparation for surgery.  What can you expect for the Preadmit Testing Visit: Appointments may be scheduled in-person or by telephone.  If a telephone visit is scheduled, you may be asked to come into the office to have  lab tests or other studies performed.   This visit will not be completed any greater than 14 days prior to your surgery.  If your surgery has been scheduled for a future date, please do not be alarmed if we have not contacted you to schedule an  appointment more than a month prior to the surgery date.    Please be prepared to provide the following information during this appointment:            -Personal medical history                                               -Medication and allergy list            -Any history of problems with anesthesia              -Recent lab work or diagnostic studies            -Please notify us  of any needs we should be aware of to provide the best care possible           -You will be provided with instructions on how to prepare for your surgery.    On The Day of Surgery:  You must have a driver to take you home after surgery, you will be asked not to drive for 24 hours following surgery.  Taxi, Gisele and non-medical transport will not be acceptable means of transportation unless you have a responsible individual who will be traveling with you.  Visitors in the surgical area:   2 people will be able to visit you in your room once your preparation for surgery has been completed. During surgery, your visitors will be asked to wait in the Surgery Waiting Area.  It is not a requirement for them to stay, if they prefer to leave and come back.  Your visitor(s) will be given an update once the surgery has been completed.  No visitors are allowed in the initial recovery room to respect patient privacy and safety.  Once you are more awake and transfer to the secondary recovery area, or are transferred to an inpatient room, visitors will again be able to see you.  To respect and protect your privacy: We will ask on the day of surgery who your driver will be and what the contact number for that individual will be. We will ask if it is okay to share information with this individual, or if there is an alternative individual that we, or the surgeon, should contact to provide updates and information. If family or friends come to the surgical information desk requesting information about you, who you have not listed with  us , no information will be given.   It may be helpful to designate someone as the main contact who will be responsible for updating your other friends and family.    PREADMIT TESTING OFFICE: [phone] SAME DAY SURGERY: [phone] We look forward to caring for you before and throughout the process of your surgery.

## 2024-01-06 NOTE — Progress Notes (Signed)
 Referring Physician:  Sadie Manna, MD [address] [facility] [place],  [place] [address]  Primary Physician:  Sadie Manna, MD  History of Present Illness: 01/06/2024 [name] is here today with a chief complaint of neck and right arm pain.  She has been dealing with the symptoms for a significant amount of time.  She has been having multiple injections and fortunately does get relief however this wears away.  She has [facility] with [facility].  She continues to have severe debilitating pain going from her neck mostly into her right upper extremity.  She did get some improvement with her right sided intralaminar injections, however these have had progressive a lowering durations of improvement.  She is here today to discuss her x-rays.  Conservative measures:  Physical therapy:  currently participating in at [facility].  Multimodal medical therapy including regular antiinflammatories:  tylenol , advil , gabapentin   Injections:   10/22/2023: Right C6-7 interlaminar ESI (90% relief) 08/21/2023: Right C7-T1 interlaminar ESI (50% relief of pain continued numbness and tingling) 07/12/2022: right subacromial injection (good relief) 01/03/2022: Right C7-T1 interlaminar epidural steroid injection performed at [facility] (90% relief)   Past Surgery: no spinal surgeries   The symptoms are causing a significant impact on the patient's life.   I have utilized the care everywhere function in epic to review the outside records available from external health systems.  Review of Systems:  A 10 point review of systems is negative, except for the pertinent positives and negatives detailed in the HPI.  Past Medical History: Past Medical History:  Diagnosis Date   Anemia    Anxiety    Cancer (HCC)    melanoma   Complication of anesthesia    nausea   Depression    Hypothyroidism     Past Surgical History: Past Surgical History:  Procedure  Laterality Date   ABDOMINAL HYSTERECTOMY     abdominal plasty     CESAREAN SECTION     LAPAROSCOPIC BILATERAL SALPINGECTOMY Bilateral 01/21/2017   Procedure: LAPAROSCOPIC BILATERAL SALPINGECTOMY;  Surgeon: Verdon Keen, MD;  Location: [facility] ORS;  Service: Gynecology;  Laterality: Bilateral;   LAPAROSCOPIC LYSIS OF ADHESIONS N/A 01/21/2017   Procedure: LAPAROSCOPIC LYSIS OF ADHESIONS;  Surgeon: Verdon Keen, MD;  Location: [facility] OR[facility];  Service: Gynecology;  Laterality: N/A;   LAPAROSCOPY N/A 01/21/2017   Procedure: LAPAROSCOPY OPERATIVE;  Surgeon: Verdon Keen, MD;  Location: [facility] OR[facility];  Service: Gynecology;  Laterality: N/A;   REDUCTION MAMMAPLASTY Bilateral 2005   mastopexy   TONSILLECTOMY     TUBAL LIGATION      Allergies: Allergies as of 01/06/2024 - Review Complete 01/06/2024  Allergen Reaction Noted   Azithromycin Other (See Comments) 07/13/2015   Metoclopramide Itching and Other (See Comments) 07/31/2014   Morphine Itching and Other (See Comments) 07/31/2014    Medications:  Current Outpatient Medications:    ARIPiprazole (ABILIFY) 2 MG tablet, Take 2 mg by mouth., Disp: , Rfl:    cyanocobalamin (,VITAMIN B-12,) 1000 MCG/ML injection, Inject 1,000 mcg into the skin every 30 (thirty) days., Disp: , Rfl:    gabapentin  (NEURONTIN ) 800 MG tablet, Take 1 tablet (800 mg total) by mouth at bedtime., Disp: 3 tablet, Rfl: 0   levothyroxine (SYNTHROID, LEVOTHROID) 175 MCG tablet, Take 175 mcg by mouth daily., Disp: , Rfl:    metoprolol succinate (TOPROL-XL) 25 MG 24 hr tablet, Take 1 tablet by mouth daily., Disp: , Rfl:    pantoprazole (PROTONIX) 20 MG tablet, Take  20 mg by mouth., Disp: , Rfl:    venlafaxine (EFFEXOR) 100 MG tablet, Take 100 mg by mouth 3 (three) times daily., Disp: , Rfl:   Social History: Social History   Tobacco Use   Smoking status: Every Day    Current packs/day: 1.50    Types: Cigarettes   Smokeless tobacco: Never  Vaping Use   Vaping status: Never  Used  Substance Use Topics   Alcohol use: Yes    Alcohol/week: 2.0 standard drinks of alcohol    Types: 2 Glasses of wine per week   Drug use: No    Family Medical History: Family History  Problem Relation Age of Onset   Breast cancer Sister 18   Breast cancer Maternal Aunt 55   Breast cancer Maternal Aunt 60    Physical Examination: Vitals:   01/06/24 1321  BP: 126/78     NEUROLOGICAL:     Awake, alert, oriented to person, place, and time.  Speech is clear and fluent.   Strength: Side Biceps Triceps Deltoid Interossei Grip Wrist Ext. Wrist Flex.  R 5 5 5 5 5 5 5   L 5 5 5 5 5 5 5     Reflexes are slight asymmetry noted in the reflexes on the right compared to the left.  Slight change in sensation in the right side upper extremity compared to the left     No evidence of dysmetria noted.  Gait is normal.    Imaging: Narrative & Impression  CLINICAL DATA:  Initial evaluation for numbness extending from the right neck into the right arm and fingers for 6 weeks.   EXAM: MRI CERVICAL SPINE WITHOUT CONTRAST   TECHNIQUE: Multiplanar, multisequence MR imaging of the cervical spine was performed. No intravenous contrast was administered.   COMPARISON:  Prior MRI from 11/20/2021   FINDINGS: Alignment: Straightening with slight reversal of the normal cervical lordosis. No listhesis.   Vertebrae: Vertebral body height maintained without acute or chronic fracture. Bone marrow signal intensity within normal limits. Few small benign hemangiomata noted. No worrisome osseous lesions. No abnormal marrow edema.   Cord: Normal signal and morphology.   Posterior Fossa, vertebral arteries, paraspinal tissues: Visualized brain and posterior fossa within normal limits. Craniocervical junction normal. Paraspinous soft tissues within normal limits. Normal flow voids seen within the vertebral arteries bilaterally. Multiple nodules present within the right thyroid  lobe,  largest of which measures up to 2.3 cm.   Disc levels:   C2-C3: Unremarkable.   C3-C4: Small central to right paracentral disc protrusion indents the ventral thecal sac (series 110, image 10). Minimal flattening of the ventral cord without cord signal changes or significant spinal stenosis. Superimposed uncovertebral spurring with mild left C4 foraminal stenosis. Right neural foramen remains patent. Appearance is relatively stable.   C4-C5: Diffuse disc bulge with bilateral uncovertebral spurring, slightly worse on the right. Flattening and partial effacement of the ventral thecal sac with mild cord flattening but no cord signal changes. Mild spinal stenosis. Mild to moderate right with mild left C5 foraminal stenosis. Appearance is similar.   C5-C6: Diffuse disc bulge with bilateral uncovertebral spurring. Flattening and partial effacement of the ventral thecal sac with resultant mild spinal stenosis. Mild left with mild-to-moderate right C6 foraminal narrowing. Appearance is similar.   C6-C7: Diffuse disc bulge with bilateral uncovertebral spurring. Superimposed right foraminal disc protrusion, slightly increased in size in prominence from prior (series 109, image 24). Flattening and partial effacement of the ventral thecal sac with resultant mild right-sided  spinal stenosis. Moderate right C7 foraminal narrowing. Left neural foramina remains patent.   C7-T1: Irregular central to right paracentral disc protrusion (series 109, images 30, 31). No significant spinal stenosis. Foramina remain patent. Appearance is similar.   IMPRESSION: 1. Right foraminal disc protrusion at C6-7, slightly increased in size and prominence from prior, which could contribute to right-sided radicular symptoms. Query right C7 radiculitis. 2. Additional multilevel cervical spondylosis with resultant mild diffuse spinal stenosis at C4-5 through C6-7. Mild to moderate right C5 through C7 foraminal  stenosis as above. Appearance is otherwise relatively similar to prior. 3. Multiple nodules within the right thyroid  lobe, largest of which measures up to 2.3 cm. These have been previously evaluated by thyroid  ultrasound on 09/30/2020. Please refer to this examination regarding any potential follow-up recommendations regarding this finding.     Electronically Signed   By: Morene Hoard M.D.   On: 10/08/2023 04:55   Narrative & Impression  CLINICAL DATA:  Pain right side of neck   EXAM: CERVICAL SPINE - COMPLETE 4+ VIEW   COMPARISON:  MRI 09/30/2023   FINDINGS: Mild reversal of cervical lordosis. Vertebral body heights are maintained. Mild degenerative changes C5-C6 and C6-C7. No suspicious change in alignment with flexion or extension.   IMPRESSION: Mild degenerative changes at C5-C6 and C6-C7.     Electronically Signed   By: Luke Bun M.D.   On: 12/18/2023 23:01    I have personally reviewed the images and agree with the above interpretation.  Notably she does have multiple levels of cervical foraminal stenosis most prominent at C5-C7 on the right.  These clearly correlate with her C6 and C7 radiculopathy symptoms.  Her x-rays show spondylosis from C5-C7 again, her segmental kyphosis at this level is less than level 11, measured at 3 degrees.  She has adequate disc height.  Medical Decision Making/Assessment and Plan: Ms. Gildersleeve is a pleasant 48 y.o. female with a right sided cervical radiculopathy.  She has been followed with the team at [facility] for injections.  She is here today with continued progressive symptoms.  She has worked with physical therapy, has had multiple injections, continues to have significant pain that is interfering with her quality of life in the right C6 and right C7 distribution.  This correlates clearly with her foraminal stenosis at the right C5-6 and C6-7 levels.  Her x-rays show less than 11 degrees of kyphosis and her disc height is  adequate.  Given all these findings I would recommend a C5-C7 anterior cervical disc arthroplasty for discectomy and decompression.  I feel that she will benefit from this as we will be able to directly decompress her nerve roots causing her radiculopathy, this will also help her avoid a need for a fusion.  We discussed risks and benefits of the procedure including but not limited to bleeding infection CSF leak, C5 palsy, nerve injury, spinal cord injury.  Need for further surgery.  Thank you for involving me in the care of this patient.    Penne MICAEL Sharps MD/MSCR Neurosurgery

## 2024-01-07 ENCOUNTER — Telehealth: Payer: Self-pay

## 2024-01-07 DIAGNOSIS — M5010 Cervical disc disorder with radiculopathy, unspecified cervical region: Secondary | ICD-10-CM

## 2024-01-07 NOTE — Telephone Encounter (Signed)
 Patient is scheduled for Friday at 8:15am at [facility].  Left message to return call to discuss new surgery dates.

## 2024-01-07 NOTE — Telephone Encounter (Signed)
 I received a call from [name] with [employer] stating that they require a minimum of 6 visits of PT over 6 weeks. Upon review, Andrea Chapman has completed 3 visits over 4 weeks (10/15/23-11/13/23). They are intending to deny her surgery authorization if we proceed with the request at this time. Alternatively, we can withdraw the request and send her back for more physical therapy. Per Mont, she cannot do the additional visits all in one week; they must be at least 1 visit per week. She recommended we withdraw the authorization request and submit a new request once the patient has completed physical therapy.  I have notified the patient of the above and placed a new PT referral. She will call me back once she has her PT appointments scheduled so we can choose a new surgery date.

## 2024-01-13 ENCOUNTER — Other Ambulatory Visit

## 2024-01-31 ENCOUNTER — Encounter
Admission: RE | Admit: 2024-01-31 | Discharge: 2024-01-31 | Disposition: A | Discharge status: Home / Self Care | Source: Ambulatory Visit | Attending: Neurosurgery | Admitting: Neurosurgery

## 2024-01-31 ENCOUNTER — Other Ambulatory Visit: Payer: Self-pay

## 2024-01-31 VITALS — BP 133/92 | HR 82 | Temp 98.1°F | Resp 16 | Ht 64.0 in | Wt 179.8 lb

## 2024-01-31 DIAGNOSIS — Z01818 Encounter for other preprocedural examination: Secondary | ICD-10-CM

## 2024-01-31 DIAGNOSIS — Z01812 Encounter for preprocedural laboratory examination: Secondary | ICD-10-CM | POA: Insufficient documentation

## 2024-01-31 DIAGNOSIS — M5010 Cervical disc disorder with radiculopathy, unspecified cervical region: Secondary | ICD-10-CM | POA: Diagnosis not present

## 2024-01-31 HISTORY — DX: Other specified postprocedural states: Z98.890

## 2024-01-31 HISTORY — DX: Nausea with vomiting, unspecified: R11.2

## 2024-01-31 LAB — TYPE AND SCREEN
ABO/RH(D): A POS
Antibody Screen: NEGATIVE

## 2024-01-31 LAB — BASIC METABOLIC PANEL WITH GFR
Anion gap: 11 (ref 5–15)
BUN: 15 mg/dL (ref 6–20)
CO2: 23 mmol/L (ref 22–32)
Calcium: 9.2 mg/dL (ref 8.9–10.3)
Chloride: 106 mmol/L (ref 98–111)
Creatinine, Ser: 0.87 mg/dL (ref 0.44–1.00)
GFR, Estimated: >60 mL/min (ref >60)
Glucose, Bld: 91 mg/dL (ref 70–99)
Potassium: 4.4 mmol/L (ref 3.5–5.1)
Sodium: 140 mmol/L (ref 135–145)

## 2024-01-31 LAB — SURGICAL PCR SCREEN
MRSA, PCR: NEGATIVE
Staphylococcus aureus: NEGATIVE

## 2024-01-31 NOTE — Patient Instructions (Addendum)
 Your procedure is scheduled on:  Thursday August 14  Report to the Registration Desk on the 1st floor of the [facility] To find out your arrival time, please call [phone] between 1PM - 3PM on:  Wednesday August 13 If your arrival time is 6:00 am, do not arrive before that time as the [facility] entrance doors do not open until 6:00 am.  REMEMBER: Instructions that are not followed completely may result in serious medical risk, up to and including death; or upon the discretion of your surgeon and anesthesiologist your surgery may need to be rescheduled.  Do not eat food after midnight the night before surgery.  No gum chewing or hard candies.  You may however, drink CLEAR liquids up to 2 hours before you are scheduled to arrive for your surgery. Do not drink anything within 2 hours of your scheduled arrival time.  Clear liquids include: - water  - apple juice without pulp - gatorade (not RED colors) - black coffee or tea (Do NOT add milk or creamers to the coffee or tea) Do NOT drink anything that is not on this list.  One week prior to surgery: Stop Anti-inflammatories (NSAIDS) such as Advil , Aleve, Ibuprofen , Motrin , Naproxen, Naprosyn and Aspirin based products such as Excedrin, Goody's Powder, BC Powder. Stop ANY OVER THE COUNTER supplements until after surgery. Cholecalciferol (WELL VITAMIN D3)  cyanocobalamin (,VITAMIN B-12,)  You may however, continue to take Tylenol  if needed for pain up until the day of surgery.  Continue taking all of your other prescription medications up until the day of surgery.  ON THE DAY OF SURGERY ONLY TAKE THESE MEDICATIONS WITH SIPS OF WATER:  levothyroxine (SYNTHROID)  ARIPiprazole (ABILIFY)  metoprolol succinate (TOPROL-XL)  pantoprazole (PROTONIX)  venlafaxine (EFFEXOR)   No Alcohol for 24 hours before or after surgery.  Do not use any recreational drugs for at least a week (preferably 2 weeks) before your surgery.  Please be  advised that the combination of cocaine and anesthesia may have negative outcomes, up to and including death. If you test positive for cocaine, your surgery will be cancelled.  On the morning of surgery brush your teeth with toothpaste and water, you may rinse your mouth with mouthwash if you wish. Do not swallow any toothpaste or mouthwash.  Use CHG Soap as directed on instruction sheet.  Do not wear jewelry, make-up, hairpins, clips or nail polish.  For welded (permanent) jewelry: bracelets, anklets, waist bands, etc.  Please have this removed prior to surgery.  If it is not removed, there is a chance that hospital personnel will need to cut it off on the day of surgery.  Do not wear lotions, powders, or perfumes.   Do not shave body hair from the neck down 48 hours before surgery.  Contact lenses, hearing aids and dentures may not be worn into surgery.  Do not bring valuables to the hospital. [facility] is not responsible for any missing/lost belongings or valuables.   Notify your doctor if there is any change in your medical condition (cold, fever, infection).  Wear comfortable clothing (specific to your surgery type) to the hospital.  After surgery, you can help prevent lung complications by doing breathing exercises.  Take deep breaths and cough every 1-2 hours.   If you are being admitted to the hospital overnight, leave your suitcase in the car. After surgery it may be brought to your room.  In case of increased patient census, it may be necessary for  you, the patient, to continue your postoperative care in the Same Day Surgery department.  If you are being discharged the day of surgery, you will not be allowed to drive home. You will need a responsible individual to drive you home and stay with you for 24 hours after surgery.   If you are taking public transportation, you will need to have a responsible individual with you.  Please call the Pre-admissions Testing Dept.  at [phone] if you have any questions about these instructions.  Surgery Visitation Policy:  Patients having surgery or a procedure may have two visitors.  Children under the age of 29 must have an adult with them who is not the patient.  Inpatient Visitation:    Visiting hours are 7 a.m. to 8 p.m. Up to four visitors are allowed at one time in a patient room. The visitors may rotate out with other people during the day.  One visitor age 61 or older may stay with the patient overnight and must be in the room by 8 p.m.   Merchandiser, retail to address health-related social needs:  [email or web address]       Pre-operative 5 CHG Bath Instructions   You can play a key role in reducing the risk of infection after surgery. Your skin needs to be as free of germs as possible. You can reduce the number of germs on your skin by washing with CHG (chlorhexidine  gluconate) soap before surgery. CHG is an antiseptic soap that kills germs and continues to kill germs even after washing.   DO NOT use if you have an allergy to chlorhexidine /CHG or antibacterial soaps. If your skin becomes reddened or irritated, stop using the CHG and notify one of our RNs at [phone].   Please shower with the CHG soap starting 4 days before surgery using the following schedule:   STARTING SUNDAY AUGUST 10     Please keep in mind the following:  DO NOT shave, including legs and underarms, starting the day of your first shower.   You may shave your face at any point before/day of surgery.  Place clean sheets on your bed the day you start using CHG soap. Use a clean washcloth (not used since being washed) for each shower. DO NOT sleep with pets once you start using the CHG.   CHG Shower Instructions:  If you choose to wash your hair and private area, wash first with your normal shampoo/soap.  After you use shampoo/soap, rinse your hair and body thoroughly to remove shampoo/soap  residue.  Turn the water OFF and apply about 3 tablespoons (45 ml) of CHG soap to a CLEAN washcloth.  Apply CHG soap ONLY FROM YOUR NECK DOWN TO YOUR TOES (washing for 3-5 minutes)  DO NOT use CHG soap on face, private areas, open wounds, or sores.  Pay special attention to the area where your surgery is being performed.  If you are having back surgery, having someone wash your back for you may be helpful. Wait 2 minutes after CHG soap is applied, then you may rinse off the CHG soap.  Pat dry with a clean towel  Put on clean clothes/pajamas   If you choose to wear lotion, please use ONLY the CHG-compatible lotions on the back of this paper.     Additional instructions for the day of surgery: DO NOT APPLY any lotions, deodorants, cologne, or perfumes.   Put on clean/comfortable clothes.  Brush your teeth.  Ask your nurse before applying any  prescription medications to the skin.      CHG Compatible Lotions   Aveeno Moisturizing lotion  Cetaphil Moisturizing Cream  Cetaphil Moisturizing Lotion  Clairol Herbal Essence Moisturizing Lotion, Dry Skin  Clairol Herbal Essence Moisturizing Lotion, Extra Dry Skin  Clairol Herbal Essence Moisturizing Lotion, Normal Skin  Curel Age Defying Therapeutic Moisturizing Lotion with Alpha Hydroxy  Curel Extreme Care Body Lotion  Curel Soothing Hands Moisturizing Hand Lotion  Curel Therapeutic Moisturizing Cream, Fragrance-Free  Curel Therapeutic Moisturizing Lotion, Fragrance-Free  Curel Therapeutic Moisturizing Lotion, Original Formula  Eucerin Daily Replenishing Lotion  Eucerin Dry Skin Therapy Plus Alpha Hydroxy Crme  Eucerin Dry Skin Therapy Plus Alpha Hydroxy Lotion  Eucerin Original Crme  Eucerin Original Lotion  Eucerin Plus Crme Eucerin Plus Lotion  Eucerin TriLipid Replenishing Lotion  Keri Anti-Bacterial Hand Lotion  Keri Deep Conditioning Original Lotion Dry Skin Formula Softly Scented  Keri Deep Conditioning Original Lotion,  Fragrance Free Sensitive Skin Formula  Keri Lotion Fast Absorbing Fragrance Free Sensitive Skin Formula  Keri Lotion Fast Absorbing Softly Scented Dry Skin Formula  Keri Original Lotion  Keri Skin Renewal Lotion Keri Silky Smooth Lotion  Keri Silky Smooth Sensitive Skin Lotion  Nivea Body Creamy Conditioning Oil  Nivea Body Extra Enriched Teacher, adult education Moisturizing Lotion Nivea Crme  Nivea Skin Firming Lotion  NutraDerm 30 Skin Lotion  NutraDerm Skin Lotion  NutraDerm Therapeutic Skin Cream  NutraDerm Therapeutic Skin Lotion  ProShield Protective Hand Cream  Provon moisturizing lotion

## 2024-02-05 ENCOUNTER — Encounter: Admitting: Physician Assistant

## 2024-02-09 ENCOUNTER — Encounter: Payer: Self-pay | Admitting: Neurosurgery

## 2024-02-12 MED ORDER — CEFAZOLIN IN SODIUM CHLORIDE 2-0.9 GM/100ML-% IV SOLN
2.0000 g | Freq: Once | INTRAVENOUS | Status: DC
  Filled 2024-02-12: qty 100

## 2024-02-12 MED ORDER — ORAL CARE MOUTH RINSE: 15.0000 mL | Freq: Once | OROMUCOSAL | Status: AC

## 2024-02-12 MED ORDER — CHLORHEXIDINE GLUCONATE 0.12 % MT SOLN
15.0000 mL | Freq: Once | OROMUCOSAL | Status: AC
  Administered 2024-02-13: 15 mL via OROMUCOSAL

## 2024-02-12 MED ORDER — CEFAZOLIN SODIUM-DEXTROSE 2-4 GM/100ML-% IV SOLN
2.0000 g | INTRAVENOUS | Status: AC
  Administered 2024-02-13: 2 g via INTRAVENOUS

## 2024-02-12 MED ORDER — LACTATED RINGERS IV SOLN: INTRAVENOUS | Status: DC

## 2024-02-13 ENCOUNTER — Encounter: Payer: Self-pay | Admitting: Neurosurgery

## 2024-02-13 ENCOUNTER — Observation Stay
Admission: RE | Admit: 2024-02-13 | Discharge: 2024-02-14 | Disposition: A | Discharge status: Home / Self Care | Source: Ambulatory Visit | Attending: Neurosurgery | Admitting: Neurosurgery

## 2024-02-13 ENCOUNTER — Ambulatory Visit: Payer: Self-pay | Admitting: Urgent Care

## 2024-02-13 ENCOUNTER — Encounter
Admission: RE | Disposition: A | Discharge status: Home / Self Care | Payer: Self-pay | Source: Ambulatory Visit | Attending: Neurosurgery

## 2024-02-13 ENCOUNTER — Other Ambulatory Visit: Payer: Self-pay

## 2024-02-13 ENCOUNTER — Ambulatory Visit

## 2024-02-13 DIAGNOSIS — Z01812 Encounter for preprocedural laboratory examination: Secondary | ICD-10-CM

## 2024-02-13 DIAGNOSIS — Z85820 Personal history of malignant melanoma of skin: Secondary | ICD-10-CM | POA: Diagnosis not present

## 2024-02-13 DIAGNOSIS — Z01818 Encounter for other preprocedural examination: Secondary | ICD-10-CM

## 2024-02-13 DIAGNOSIS — F17210 Nicotine dependence, cigarettes, uncomplicated: Secondary | ICD-10-CM | POA: Diagnosis not present

## 2024-02-13 DIAGNOSIS — E039 Hypothyroidism, unspecified: Secondary | ICD-10-CM | POA: Insufficient documentation

## 2024-02-13 DIAGNOSIS — M5010 Cervical disc disorder with radiculopathy, unspecified cervical region: Secondary | ICD-10-CM | POA: Diagnosis not present

## 2024-02-13 DIAGNOSIS — M542 Cervicalgia: Secondary | ICD-10-CM | POA: Diagnosis present

## 2024-02-13 DIAGNOSIS — M5412 Radiculopathy, cervical region: Principal | ICD-10-CM | POA: Diagnosis present

## 2024-02-13 HISTORY — PX: CERVICAL ANTERIOR DISC ARTHROPLASTY, 2 LEVEL: SHX7538

## 2024-02-13 LAB — CBC
HCT: 44.2 % (ref 36.0–46.0)
Hemoglobin: 15 g/dL (ref 12.0–15.0)
MCH: 31.1 pg (ref 26.0–34.0)
MCHC: 33.9 g/dL (ref 30.0–36.0)
MCV: 91.5 fL (ref 80.0–100.0)
Platelets: 204 K/uL (ref 150–400)
RBC: 4.83 MIL/uL (ref 3.87–5.11)
RDW: 12.1 % (ref 11.5–15.5)
WBC: 8.5 K/uL (ref 4.0–10.5)
nRBC: 0 % (ref 0.0–0.2)

## 2024-02-13 SURGERY — CERVICAL ANTERIOR DISC ARTHROPLASTY, 2 LEVEL
Anesthesia: General

## 2024-02-13 MED ORDER — ACETAMINOPHEN 650 MG RE SUPP: 650.0000 mg | RECTAL | Status: DC | PRN

## 2024-02-13 MED ORDER — PROPOFOL 1000 MG/100ML IV EMUL
INTRAVENOUS | Status: AC
  Filled 2024-02-13: qty 100

## 2024-02-13 MED ORDER — DROPERIDOL 2.5 MG/ML IJ SOLN: 0.6250 mg | Freq: Once | INTRAMUSCULAR | Status: DC | PRN

## 2024-02-13 MED ORDER — ACETAMINOPHEN 500 MG PO TABS
1000.0000 mg | ORAL_TABLET | Freq: Four times a day (QID) | ORAL | Status: DC
  Administered 2024-02-14 (×2): 1000 mg via ORAL
  Filled 2024-02-13 (×2): qty 2

## 2024-02-13 MED ORDER — ENOXAPARIN SODIUM 40 MG/0.4ML IJ SOSY
40.0000 mg | PREFILLED_SYRINGE | INTRAMUSCULAR | Status: DC
  Administered 2024-02-14: 40 mg via SUBCUTANEOUS
  Filled 2024-02-13: qty 0.4

## 2024-02-13 MED ORDER — SUCCINYLCHOLINE CHLORIDE 200 MG/10ML IV SOSY
PREFILLED_SYRINGE | INTRAVENOUS | Status: DC | PRN
  Administered 2024-02-13: 100 mg via INTRAVENOUS

## 2024-02-13 MED ORDER — OXYCODONE HCL 5 MG PO TABS
5.0000 mg | ORAL_TABLET | Freq: Once | ORAL | Status: AC | PRN
  Administered 2024-02-13: 5 mg via ORAL

## 2024-02-13 MED ORDER — SODIUM CHLORIDE 0.9% FLUSH
3.0000 mL | Freq: Two times a day (BID) | INTRAVENOUS | Status: DC
  Administered 2024-02-14: 3 mL via INTRAVENOUS

## 2024-02-13 MED ORDER — PANTOPRAZOLE SODIUM 20 MG PO TBEC
20.0000 mg | DELAYED_RELEASE_TABLET | Freq: Every day | ORAL | Status: DC
  Administered 2024-02-14: 20 mg via ORAL
  Filled 2024-02-13: qty 1

## 2024-02-13 MED ORDER — METHOCARBAMOL 1000 MG/10ML IJ SOLN: 500.0000 mg | Freq: Four times a day (QID) | INTRAMUSCULAR | Status: DC | PRN

## 2024-02-13 MED ORDER — SODIUM CHLORIDE 0.9% FLUSH: 3.0000 mL | INTRAVENOUS | Status: DC | PRN

## 2024-02-13 MED ORDER — FENTANYL CITRATE (PF) 100 MCG/2ML IJ SOLN: 25.0000 ug | INTRAMUSCULAR | Status: DC | PRN

## 2024-02-13 MED ORDER — PHENYLEPHRINE HCL-NACL 20-0.9 MG/250ML-% IV SOLN
INTRAVENOUS | Status: AC
  Filled 2024-02-13: qty 250

## 2024-02-13 MED ORDER — ONDANSETRON HCL 4 MG/2ML IJ SOLN
INTRAMUSCULAR | Status: DC | PRN
  Administered 2024-02-13: 4 mg via INTRAVENOUS

## 2024-02-13 MED ORDER — OXYCODONE HCL 5 MG/5ML PO SOLN: 5.0000 mg | Freq: Once | ORAL | Status: AC | PRN

## 2024-02-13 MED ORDER — KETAMINE HCL 50 MG/5ML IJ SOSY
PREFILLED_SYRINGE | INTRAMUSCULAR | Status: DC | PRN
  Administered 2024-02-13: 30 mg via INTRAVENOUS

## 2024-02-13 MED ORDER — PROPOFOL 10 MG/ML IV BOLUS
INTRAVENOUS | Status: DC | PRN
  Administered 2024-02-13: 120 ug/kg/min via INTRAVENOUS
  Administered 2024-02-13: 140 mg via INTRAVENOUS
  Administered 2024-02-13: 60 mg via INTRAVENOUS

## 2024-02-13 MED ORDER — SENNA 8.6 MG PO TABS
1.0000 | ORAL_TABLET | Freq: Two times a day (BID) | ORAL | Status: DC
  Administered 2024-02-13 – 2024-02-14 (×2): 8.6 mg via ORAL
  Filled 2024-02-13 (×2): qty 1

## 2024-02-13 MED ORDER — MENTHOL 3 MG MT LOZG: 1.0000 | LOZENGE | OROMUCOSAL | Status: DC | PRN

## 2024-02-13 MED ORDER — VITAMIN D 25 MCG (1000 UNIT) PO TABS
5000.0000 [IU] | ORAL_TABLET | Freq: Every day | ORAL | Status: DC
  Administered 2024-02-14: 5000 [IU] via ORAL
  Filled 2024-02-13: qty 5

## 2024-02-13 MED ORDER — SUCCINYLCHOLINE CHLORIDE 200 MG/10ML IV SOSY
PREFILLED_SYRINGE | INTRAVENOUS | Status: AC
Start: 2024-02-13 — End: 2024-02-13
  Filled 2024-02-13: qty 10

## 2024-02-13 MED ORDER — HYDROMORPHONE HCL 1 MG/ML IJ SOLN: 0.5000 mg | INTRAMUSCULAR | Status: AC | PRN

## 2024-02-13 MED ORDER — MIDAZOLAM HCL 2 MG/2ML IJ SOLN
INTRAMUSCULAR | Status: AC
  Filled 2024-02-13: qty 2

## 2024-02-13 MED ORDER — ACETAMINOPHEN 10 MG/ML IV SOLN
INTRAVENOUS | Status: AC
  Filled 2024-02-13: qty 100

## 2024-02-13 MED ORDER — SODIUM CHLORIDE 0.9 % IV SOLN
INTRAVENOUS | Status: DC | PRN
  Administered 2024-02-13: .1 ug/kg/min via INTRAVENOUS

## 2024-02-13 MED ORDER — GLYCOPYRROLATE 0.2 MG/ML IJ SOLN
INTRAMUSCULAR | Status: DC | PRN
Start: 2024-02-13 — End: 2024-02-13
  Administered 2024-02-13: .2 mg via INTRAVENOUS

## 2024-02-13 MED ORDER — ARIPIPRAZOLE 2 MG PO TABS
5.0000 mg | ORAL_TABLET | Freq: Every day | ORAL | Status: DC
  Administered 2024-02-14: 5 mg via ORAL
  Filled 2024-02-13: qty 3

## 2024-02-13 MED ORDER — KETOROLAC TROMETHAMINE 15 MG/ML IJ SOLN
15.0000 mg | Freq: Four times a day (QID) | INTRAMUSCULAR | Status: AC
  Administered 2024-02-13 – 2024-02-14 (×4): 15 mg via INTRAVENOUS
  Filled 2024-02-13 (×3): qty 1

## 2024-02-13 MED ORDER — MIDAZOLAM HCL 2 MG/2ML IJ SOLN
INTRAMUSCULAR | Status: DC | PRN
  Administered 2024-02-13: 2 mg via INTRAVENOUS

## 2024-02-13 MED ORDER — ACETAMINOPHEN 325 MG PO TABS: 650.0000 mg | ORAL_TABLET | ORAL | Status: DC | PRN

## 2024-02-13 MED ORDER — OXYCODONE HCL 5 MG PO TABS
10.0000 mg | ORAL_TABLET | ORAL | Status: DC | PRN
  Administered 2024-02-14: 10 mg via ORAL
  Filled 2024-02-13: qty 2

## 2024-02-13 MED ORDER — SORBITOL 70 % SOLN: 30.0000 mL | Freq: Every day | Status: DC | PRN

## 2024-02-13 MED ORDER — PHENOL 1.4 % MT LIQD: 1.0000 | OROMUCOSAL | Status: DC | PRN

## 2024-02-13 MED ORDER — DEXAMETHASONE SODIUM PHOSPHATE 10 MG/ML IJ SOLN
INTRAMUSCULAR | Status: DC | PRN
  Administered 2024-02-13: 10 mg via INTRAVENOUS

## 2024-02-13 MED ORDER — ACETAMINOPHEN 10 MG/ML IV SOLN
1000.0000 mg | Freq: Once | INTRAVENOUS | Status: DC | PRN
Start: 2024-02-13 — End: 2024-02-13

## 2024-02-13 MED ORDER — ONDANSETRON HCL 4 MG/2ML IJ SOLN: 4.0000 mg | Freq: Four times a day (QID) | INTRAMUSCULAR | Status: DC | PRN

## 2024-02-13 MED ORDER — KETOROLAC TROMETHAMINE 15 MG/ML IJ SOLN
INTRAMUSCULAR | Status: AC
  Filled 2024-02-13: qty 1

## 2024-02-13 MED ORDER — FENTANYL CITRATE (PF) 100 MCG/2ML IJ SOLN
INTRAMUSCULAR | Status: AC
  Filled 2024-02-13: qty 2

## 2024-02-13 MED ORDER — REMIFENTANIL HCL 1 MG IV SOLR
INTRAVENOUS | Status: AC
Start: 2024-02-13 — End: 2024-02-13
  Filled 2024-02-13: qty 1000

## 2024-02-13 MED ORDER — PROPOFOL 1000 MG/100ML IV EMUL
INTRAVENOUS | Status: AC
Start: 2024-02-13 — End: 2024-02-13
  Filled 2024-02-13: qty 100

## 2024-02-13 MED ORDER — ONDANSETRON HCL 4 MG PO TABS: 4.0000 mg | ORAL_TABLET | Freq: Four times a day (QID) | ORAL | Status: DC | PRN

## 2024-02-13 MED ORDER — OXYCODONE HCL 5 MG PO TABS
5.0000 mg | ORAL_TABLET | ORAL | Status: DC | PRN
  Administered 2024-02-13 – 2024-02-14 (×3): 5 mg via ORAL
  Filled 2024-02-13 (×2): qty 1

## 2024-02-13 MED ORDER — LIDOCAINE HCL (CARDIAC) PF 100 MG/5ML IV SOSY
PREFILLED_SYRINGE | INTRAVENOUS | Status: DC | PRN
  Administered 2024-02-13: 50 mg via INTRAVENOUS

## 2024-02-13 MED ORDER — OXYCODONE HCL 5 MG PO TABS
ORAL_TABLET | ORAL | Status: AC
Start: 2024-02-13 — End: 2024-02-13
  Filled 2024-02-13: qty 1

## 2024-02-13 MED ORDER — DOCUSATE SODIUM 100 MG PO CAPS
100.0000 mg | ORAL_CAPSULE | Freq: Two times a day (BID) | ORAL | Status: DC
  Administered 2024-02-13 – 2024-02-14 (×2): 100 mg via ORAL
  Filled 2024-02-13 (×2): qty 1

## 2024-02-13 MED ORDER — MAGNESIUM CITRATE PO SOLN: 1.0000 | Freq: Once | ORAL | Status: DC | PRN

## 2024-02-13 MED ORDER — ONDANSETRON HCL 4 MG/2ML IJ SOLN
INTRAMUSCULAR | Status: AC
  Filled 2024-02-13: qty 2

## 2024-02-13 MED ORDER — ACETAMINOPHEN 10 MG/ML IV SOLN
INTRAVENOUS | Status: DC | PRN
  Administered 2024-02-13: 1000 mg via INTRAVENOUS

## 2024-02-13 MED ORDER — METHOCARBAMOL 500 MG PO TABS
500.0000 mg | ORAL_TABLET | Freq: Four times a day (QID) | ORAL | Status: DC | PRN
  Administered 2024-02-13 – 2024-02-14 (×2): 500 mg via ORAL
  Filled 2024-02-13 (×2): qty 1

## 2024-02-13 MED ORDER — LEVOTHYROXINE SODIUM 112 MCG PO TABS
112.0000 ug | ORAL_TABLET | Freq: Every day | ORAL | Status: DC
  Administered 2024-02-14: 112 ug via ORAL
  Filled 2024-02-13: qty 1

## 2024-02-13 MED ORDER — DEXAMETHASONE SODIUM PHOSPHATE 10 MG/ML IJ SOLN
INTRAMUSCULAR | Status: AC
  Filled 2024-02-13: qty 1

## 2024-02-13 MED ORDER — OXYCODONE HCL 5 MG PO TABS
ORAL_TABLET | ORAL | Status: AC
  Filled 2024-02-13: qty 1

## 2024-02-13 MED ORDER — KETAMINE HCL 50 MG/5ML IJ SOSY
PREFILLED_SYRINGE | INTRAMUSCULAR | Status: AC
Start: 2024-02-13 — End: 2024-02-13
  Filled 2024-02-13: qty 5

## 2024-02-13 MED ORDER — ATORVASTATIN CALCIUM 10 MG PO TABS
10.0000 mg | ORAL_TABLET | Freq: Every day | ORAL | Status: DC
  Administered 2024-02-14: 10 mg via ORAL
  Filled 2024-02-13: qty 1

## 2024-02-13 MED ORDER — GLYCOPYRROLATE 0.2 MG/ML IJ SOLN
INTRAMUSCULAR | Status: AC
Start: 2024-02-13 — End: 2024-02-13
  Filled 2024-02-13: qty 1

## 2024-02-13 MED ORDER — CEFAZOLIN SODIUM-DEXTROSE 2-4 GM/100ML-% IV SOLN
INTRAVENOUS | Status: AC
  Filled 2024-02-13: qty 100

## 2024-02-13 MED ORDER — BUPIVACAINE-EPINEPHRINE (PF) 0.5% -1:200000 IJ SOLN
INTRAMUSCULAR | Status: DC | PRN
  Administered 2024-02-13: 10 mL

## 2024-02-13 MED ORDER — SODIUM CHLORIDE 0.9 % IV SOLN: 250.0000 mL | INTRAVENOUS | Status: DC

## 2024-02-13 MED ORDER — FENTANYL CITRATE (PF) 100 MCG/2ML IJ SOLN
INTRAMUSCULAR | Status: AC
Start: 2024-02-13 — End: 2024-02-13
  Filled 2024-02-13: qty 2

## 2024-02-13 MED ORDER — CHLORHEXIDINE GLUCONATE 0.12 % MT SOLN
OROMUCOSAL | Status: AC
  Filled 2024-02-13: qty 15

## 2024-02-13 MED ORDER — PHENYLEPHRINE HCL-NACL 20-0.9 MG/250ML-% IV SOLN
INTRAVENOUS | Status: DC | PRN
Start: 2024-02-13 — End: 2024-02-13
  Administered 2024-02-13: 50 ug/min via INTRAVENOUS

## 2024-02-13 MED ORDER — FENTANYL CITRATE (PF) 100 MCG/2ML IJ SOLN
INTRAMUSCULAR | Status: DC | PRN
  Administered 2024-02-13: 50 ug via INTRAVENOUS

## 2024-02-13 MED ORDER — POLYETHYLENE GLYCOL 3350 17 G PO PACK: 17.0000 g | PACK | Freq: Every day | ORAL | Status: DC | PRN

## 2024-02-13 MED ORDER — VENLAFAXINE HCL 37.5 MG PO TABS
100.0000 mg | ORAL_TABLET | Freq: Two times a day (BID) | ORAL | Status: DC
  Administered 2024-02-14: 100 mg via ORAL
  Filled 2024-02-13 (×2): qty 1

## 2024-02-13 MED ORDER — PROPOFOL 10 MG/ML IV BOLUS
INTRAVENOUS | Status: AC
  Filled 2024-02-13: qty 20

## 2024-02-13 MED ORDER — METOPROLOL SUCCINATE ER 25 MG PO TB24
25.0000 mg | ORAL_TABLET | Freq: Every day | ORAL | Status: DC
  Administered 2024-02-14: 25 mg via ORAL
  Filled 2024-02-13: qty 1

## 2024-02-13 SURGICAL SUPPLY — 30 items
BUR NEURO DRILL SOFT 3.0X3.8M (BURR) ×2 IMPLANT
DERMABOND ADVANCED .7 DNX12 (GAUZE/BANDAGES/DRESSINGS) ×2 IMPLANT
DISC MOBI-C CERV STD 15X17 H4. (Miscellaneous) IMPLANT
DISC MOBI-C CERVICAL 15X15X4.5 (Neurosurgery Supplies) IMPLANT
DRAPE C-ARM XRAY 36X54 (DRAPES) ×4 IMPLANT
DRAPE LAPAROTOMY 77X122 PED (DRAPES) ×2 IMPLANT
DRAPE MICROSCOPE SPINE 48X150 (DRAPES) ×2 IMPLANT
DRAPE SURG 17X11 SM STRL (DRAPES) ×2 IMPLANT
ELECTRODE CAUTERY BLDE TIP 2.5 (TIP) ×2 IMPLANT
ELECTRODE REM PT RTRN 9FT ADLT (ELECTROSURGICAL) ×2 IMPLANT
GLOVE BIOGEL PI IND STRL 7.0 (GLOVE) ×2 IMPLANT
GLOVE BIOGEL PI IND STRL 8 (GLOVE) ×2 IMPLANT
GLOVE SURG SYN 7.0 PF PI (GLOVE) ×2 IMPLANT
GLOVE SURG SYN 7.5 PF PI (GLOVE) ×4 IMPLANT
GOWN SRG LRG LVL 4 IMPRV REINF (GOWNS) ×2 IMPLANT
GOWN SRG XL LVL 3 NONREINFORCE (GOWNS) ×2 IMPLANT
KIT TURNOVER KIT A (KITS) ×2 IMPLANT
MANIFOLD NEPTUNE II (INSTRUMENTS) ×2 IMPLANT
NS IRRIG 500ML POUR BTL (IV SOLUTION) ×2 IMPLANT
PACK LAMINECTOMY ARMC (PACKS) ×2 IMPLANT
PAD ARMBOARD POSITIONER FOAM (MISCELLANEOUS) ×2 IMPLANT
PIN CASPAR SPINAL 12MM (PIN) IMPLANT
SPONGE KITTNER 5P (MISCELLANEOUS) ×2 IMPLANT
SURGIFLO W/THROMBIN 8M KIT (HEMOSTASIS) ×2 IMPLANT
SUT STRATA 3-0 15 PS-2 (SUTURE) ×2 IMPLANT
SUT VICRYL 3-0 CR8 SH (SUTURE) ×2 IMPLANT
SYR 20ML LL LF (SYRINGE) ×2 IMPLANT
TAPE CLOTH 3X10 WHT NS LF (GAUZE/BANDAGES/DRESSINGS) ×4 IMPLANT
TRAP FLUID SMOKE EVACUATOR (MISCELLANEOUS) ×2 IMPLANT
TRAY FOLEY SLVR 16FR LF STAT (SET/KITS/TRAYS/PACK) IMPLANT

## 2024-02-13 NOTE — H&P (Signed)
 Referring Physician:  Claudene Penne ORN, MD [address] [place],  [place] [address]  Primary Physician:  Sadie Manna, MD  History of Present Illness: 02/13/2024 [name] is here today with a chief complaint of neck and right arm pain.  She has been dealing with the symptoms for a significant amount of time.  She has been having multiple injections and fortunately does get relief however this wears away.  She has had physical therapy with [facility].  She continues to have severe debilitating pain going from her neck mostly into her right upper extremity.  She did get some improvement with her right sided intralaminar injections, however these have had progressive a lowering durations of improvement.  She is here today to discuss her x-rays.  Conservative measures:  Physical therapy:  currently participating in at [facility].  Multimodal medical therapy including regular antiinflammatories:  tylenol , advil , gabapentin   Injections:   10/22/2023: Right C6-7 interlaminar ESI (90% relief) 08/21/2023: Right C7-T1 interlaminar ESI (50% relief of pain continued numbness and tingling) 07/12/2022: right subacromial injection (good relief) 01/03/2022: Right C7-T1 interlaminar epidural steroid injection performed at [facility] imaging (90% relief)   Past Surgery: no spinal surgeries   The symptoms are causing a significant impact on the patient's life.   I have utilized the care everywhere function in epic to review the outside records available from external health systems.  Review of Systems:  A 10 point review of systems is negative, except for the pertinent positives and negatives detailed in the HPI.  Past Medical History: Past Medical History:  Diagnosis Date   Anemia    Anxiety    Cancer (HCC)    melanoma   Complication of anesthesia    nausea   Depression    Hypothyroidism    PONV (postoperative nausea and vomiting)     Past Surgical History: Past  Surgical History:  Procedure Laterality Date   ABDOMINAL HYSTERECTOMY     abdominal plasty     CESAREAN SECTION     LAPAROSCOPIC BILATERAL SALPINGECTOMY Bilateral 01/21/2017   Procedure: LAPAROSCOPIC BILATERAL SALPINGECTOMY;  Surgeon: Verdon Keen, MD;  Location: [facility];  Service: Gynecology;  Laterality: Bilateral;   LAPAROSCOPIC LYSIS OF ADHESIONS N/A 01/21/2017   Procedure: LAPAROSCOPIC LYSIS OF ADHESIONS;  Surgeon: Verdon Keen, MD;  Location: [facility];  Service: Gynecology;  Laterality: N/A;   LAPAROSCOPY N/A 01/21/2017   Procedure: LAPAROSCOPY OPERATIVE;  Surgeon: Verdon Keen, MD;  Location: [facility] OR[facility];  Service: Gynecology;  Laterality: N/A;   REDUCTION MAMMAPLASTY Bilateral 2005   mastopexy   TONSILLECTOMY     TUBAL LIGATION      Allergies: Allergies as of 01/06/2024 - Review Complete 01/06/2024  Allergen Reaction Noted   Azithromycin Other (See Comments) 07/13/2015   Metoclopramide Itching and Other (See Comments) 07/31/2014   Morphine Itching and Other (See Comments) 07/31/2014    Medications:  Current Facility-Administered Medications:    ceFAZolin  (ANCEF ) IVPB 2g/100 mL premix, 2 g, Intravenous, 60 min Pre-Op, Claudene Penne ORN, MD   lactated ringers  infusion, , Intravenous, Continuous, Myra Lynwood MATSU, MD  Social History: Social History   Tobacco Use   Smoking status: Every Day    Current packs/day: 1.50    Average packs/day: 1.5 packs/day for 3.6 years (5.4 ttl pk-yrs)    Types: Cigarettes    Start date: 2022   Smokeless tobacco: Never  Vaping Use   Vaping status: Never Used  Substance Use Topics   Alcohol use: Yes  Alcohol/week: 2.0 standard drinks of alcohol    Types: 2 Glasses of wine per week    Comment: occ   Drug use: Yes    Types: Marijuana    Family Medical History: Family History  Problem Relation Age of Onset   Breast cancer Sister 76   Breast cancer Maternal Aunt 55   Breast cancer Maternal Aunt 60    Physical  Examination: Vitals:   02/13/24 0839  BP: 139/88  Pulse: 66  Resp: 17  Temp: 98.3 F (36.8 C)  SpO2: 100%   GENERAL: Heart and Lungs are clear    NEUROLOGICAL:     Awake, alert, oriented to person, place, and time.  Speech is clear and fluent.   Strength: Side Biceps Triceps Deltoid Interossei Grip Wrist Ext. Wrist Flex.  R 5 5 5 5 5 5 5   L 5 5 5 5 5 5 5     Reflexes are slight asymmetry noted in the reflexes on the right compared to the left.  Slight change in sensation in the right side upper extremity compared to the left     No evidence of dysmetria noted.  Gait is normal.    Imaging: Narrative & Impression  CLINICAL DATA:  Initial evaluation for numbness extending from the right neck into the right arm and fingers for 6 weeks.   EXAM: MRI CERVICAL SPINE WITHOUT CONTRAST   TECHNIQUE: Multiplanar, multisequence MR imaging of the cervical spine was performed. No intravenous contrast was administered.   COMPARISON:  Prior MRI from 11/20/2021   FINDINGS: Alignment: Straightening with slight reversal of the normal cervical lordosis. No listhesis.   Vertebrae: Vertebral body height maintained without acute or chronic fracture. Bone marrow signal intensity within normal limits. Few small benign hemangiomata noted. No worrisome osseous lesions. No abnormal marrow edema.   Cord: Normal signal and morphology.   Posterior Fossa, vertebral arteries, paraspinal tissues: Visualized brain and posterior fossa within normal limits. Craniocervical junction normal. Paraspinous soft tissues within normal limits. Normal flow voids seen within the vertebral arteries bilaterally. Multiple nodules present within the right thyroid  lobe, largest of which measures up to 2.3 cm.   Disc levels:   C2-C3: Unremarkable.   C3-C4: Small central to right paracentral disc protrusion indents the ventral thecal sac (series 110, image 10). Minimal flattening of the ventral cord  without cord signal changes or significant spinal stenosis. Superimposed uncovertebral spurring with mild left C4 foraminal stenosis. Right neural foramen remains patent. Appearance is relatively stable.   C4-C5: Diffuse disc bulge with bilateral uncovertebral spurring, slightly worse on the right. Flattening and partial effacement of the ventral thecal sac with mild cord flattening but no cord signal changes. Mild spinal stenosis. Mild to moderate right with mild left C5 foraminal stenosis. Appearance is similar.   C5-C6: Diffuse disc bulge with bilateral uncovertebral spurring. Flattening and partial effacement of the ventral thecal sac with resultant mild spinal stenosis. Mild left with mild-to-moderate right C6 foraminal narrowing. Appearance is similar.   C6-C7: Diffuse disc bulge with bilateral uncovertebral spurring. Superimposed right foraminal disc protrusion, slightly increased in size in prominence from prior (series 109, image 24). Flattening and partial effacement of the ventral thecal sac with resultant mild right-sided spinal stenosis. Moderate right C7 foraminal narrowing. Left neural foramina remains patent.   C7-T1: Irregular central to right paracentral disc protrusion (series 109, images 30, 31). No significant spinal stenosis. Foramina remain patent. Appearance is similar.   IMPRESSION: 1. Right foraminal disc protrusion at C6-7, slightly  increased in size and prominence from prior, which could contribute to right-sided radicular symptoms. Query right C7 radiculitis. 2. Additional multilevel cervical spondylosis with resultant mild diffuse spinal stenosis at C4-5 through C6-7. Mild to moderate right C5 through C7 foraminal stenosis as above. Appearance is otherwise relatively similar to prior. 3. Multiple nodules within the right thyroid  lobe, largest of which measures up to 2.3 cm. These have been previously evaluated by thyroid  ultrasound on 09/30/2020.  Please refer to this examination regarding any potential follow-up recommendations regarding this finding.     Electronically Signed   By: Morene Hoard M.D.   On: 10/08/2023 04:55   Narrative & Impression  CLINICAL DATA:  Pain right side of neck   EXAM: CERVICAL SPINE - COMPLETE 4+ VIEW   COMPARISON:  MRI 09/30/2023   FINDINGS: Mild reversal of cervical lordosis. Vertebral body heights are maintained. Mild degenerative changes C5-C6 and C6-C7. No suspicious change in alignment with flexion or extension.   IMPRESSION: Mild degenerative changes at C5-C6 and C6-C7.     Electronically Signed   By: Luke Bun M.D.   On: 12/18/2023 23:01    I have personally reviewed the images and agree with the above interpretation.  Notably she does have multiple levels of cervical foraminal stenosis most prominent at C5-C7 on the right.  These clearly correlate with her C6 and C7 radiculopathy symptoms.  Her x-rays show spondylosis from C5-C7 again, her segmental kyphosis at this level is less than level 11, measured at 3 degrees.  She has adequate disc height.  Medical Decision Making/Assessment and Plan: Ms. Mcguiness is a pleasant 48 y.o. female with a right sided cervical radiculopathy.  She has been followed with the team at [facility] for injections.  She is here today with continued progressive symptoms.  She has worked with physical therapy, has had multiple injections, continues to have significant pain that is interfering with her quality of life in the right C6 and right C7 distribution.  This correlates clearly with her foraminal stenosis at the right C5-6 and C6-7 levels.  Her x-rays show less than 11 degrees of kyphosis and her disc height is adequate.  Given all these findings I would recommend a C5-C7 anterior cervical disc arthroplasty for discectomy and decompression.  I feel that she will benefit from this as we will be able to directly decompress her nerve roots causing her  radiculopathy, this will also help her avoid a need for a fusion.  We discussed risks and benefits of the procedure including but not limited to bleeding infection CSF leak, C5 palsy, nerve injury, spinal cord injury.  Need for further surgery.  Thank you for involving me in the care of this patient.    Penne MICAEL Sharps MD/MSCR Neurosurgery

## 2024-02-13 NOTE — Anesthesia Preprocedure Evaluation (Addendum)
 Anesthesia Evaluation  Patient identified by MRN, date of birth, ID band Patient awake    Reviewed: Allergy & Precautions, H&P , NPO status , Patient's Chart, lab work & pertinent test results  History of Anesthesia Complications (+) PONV and history of anesthetic complications  Airway Mallampati: III  TM Distance: >3 FB Neck ROM: full    Dental no notable dental hx.    Pulmonary Current Smoker and Patient abstained from smoking.   Pulmonary exam normal        Cardiovascular negative cardio ROS Normal cardiovascular exam     Neuro/Psych  PSYCHIATRIC DISORDERS Anxiety Depression     Neuromuscular disease    GI/Hepatic Neg liver ROS,GERD  Controlled,,  Endo/Other  Hypothyroidism    Renal/GU      Musculoskeletal   Abdominal  (+) + obese  Peds  Hematology   Anesthesia Other Findings Past Medical History: No date: Anemia No date: Anxiety No date: Cancer (HCC)     Comment:  melanoma No date: Complication of anesthesia     Comment:  nausea No date: Depression No date: Hypothyroidism No date: PONV (postoperative nausea and vomiting)  Past Surgical History: No date: ABDOMINAL HYSTERECTOMY No date: abdominal plasty No date: CESAREAN SECTION 01/21/2017: LAPAROSCOPIC BILATERAL SALPINGECTOMY; Bilateral     Comment:  Procedure: LAPAROSCOPIC BILATERAL SALPINGECTOMY;                Surgeon: Verdon Keen, MD;  Location: [facility];                Service: Gynecology;  Laterality: Bilateral; 01/21/2017: LAPAROSCOPIC LYSIS OF ADHESIONS; N/A     Comment:  Procedure: LAPAROSCOPIC LYSIS OF ADHESIONS;  Surgeon:               Verdon Keen, MD;  Location: [facility];  Service:               Gynecology;  Laterality: N/A; 01/21/2017: LAPAROSCOPY; N/A     Comment:  Procedure: LAPAROSCOPY OPERATIVE;  Surgeon: Verdon Keen, MD;  Location: [facility] OR[facility];  Service: Gynecology;                Laterality: N/A; 2005:  REDUCTION MAMMAPLASTY; Bilateral     Comment:  mastopexy No date: TONSILLECTOMY No date: TUBAL LIGATION  BMI    Body Mass Index: 30.86 kg/m      Reproductive/Obstetrics negative OB ROS                              Anesthesia Physical Anesthesia Plan  ASA: 2  Anesthesia Plan: General ETT   Post-op Pain Management: Toradol  IV (intra-op)* and Ofirmev  IV (intra-op)*   Induction: Intravenous  PONV Risk Score and Plan: 3 and Ondansetron , Dexamethasone , Midazolam  and TIVA  Airway Management Planned: Oral ETT  Additional Equipment:   Intra-op Plan:   Post-operative Plan: Extubation in OR  Informed Consent: I have reviewed the patients History and Physical, chart, labs and discussed the procedure including the risks, benefits and alternatives for the proposed anesthesia with the patient or authorized representative who has indicated his/her understanding and acceptance.     Dental Advisory Given  Plan Discussed with: CRNA and Surgeon  Anesthesia Plan Comments:          Anesthesia Quick Evaluation

## 2024-02-13 NOTE — Transfer of Care (Signed)
 Immediate Anesthesia Transfer of Care Note  Patient: Andrea Chapman  Procedure(s) Performed: CERVICAL ANTERIOR DISC ARTHROPLASTY, 2 LEVEL  Patient Location: PACU  Anesthesia Type:General  Level of Consciousness: drowsy and patient cooperative  Airway & Oxygen Therapy: Patient Spontanous Breathing  Post-op Assessment: Report given to RN and Post -op Vital signs reviewed and stable  Post vital signs: Reviewed and stable  Last Vitals:  Vitals Value Taken Time  BP 159/90 02/13/24 13:15  Temp 36.1 C 02/13/24 13:08  Pulse 64 02/13/24 13:17  Resp 18 02/13/24 13:17  SpO2 98 % 02/13/24 13:17  Vitals shown include unfiled device data.  Last Pain:  Vitals:   02/13/24 1308  TempSrc:   PainSc: 0-No pain         Complications: No notable events documented.

## 2024-02-13 NOTE — Anesthesia Postprocedure Evaluation (Signed)
 Anesthesia Post Note  Patient: Andrea Chapman  Procedure(s) Performed: CERVICAL ANTERIOR DISC ARTHROPLASTY, 2 LEVEL  Patient location during evaluation: PACU Anesthesia Type: General Level of consciousness: awake and alert Pain management: pain level controlled Vital Signs Assessment: post-procedure vital signs reviewed and stable Respiratory status: spontaneous breathing, nonlabored ventilation and respiratory function stable Cardiovascular status: blood pressure returned to baseline and stable Postop Assessment: no apparent nausea or vomiting Anesthetic complications: no   No notable events documented.   Last Vitals:  Vitals:   02/13/24 1345 02/13/24 1400  BP: (!) 145/85 (!) 151/86  Pulse: 65 65  Resp: 13 16  Temp: 36.6 C 36.8 C  SpO2: 97% 96%    Last Pain:  Vitals:   02/13/24 1400  TempSrc: Oral  PainSc:                  Camellia Merilee Louder

## 2024-02-13 NOTE — Plan of Care (Signed)
   Problem: Activity: Goal: Risk for activity intolerance will decrease Outcome: Progressing   Problem: Pain Managment: Goal: General experience of comfort will improve and/or be controlled Outcome: Progressing   Problem: Safety: Goal: Ability to remain free from injury will improve Outcome: Progressing

## 2024-02-13 NOTE — Op Note (Signed)
 Indications: [name] is a 48 y.o. female with cervical radiculopathy.  It has been progressive and refractory to conservative care.  She is found to have cervical stenosis and given her ongoing symptoms that were refractory to conservative care, physical therapy trial, injections, and medical management.  She was recommended to undergo a cervical disc arthroplasty at C5-C7.  Findings: Severe stenosis noted on the C6-7 level on the right, there was bilateral more moderate stenosis at the C5-6 level well decompressed at the end of the procedure  Preoperative Diagnosis: M50.10 Cervical disc disorder with radiculopathy of cervical region  Postoperative Diagnosis: M50.10 Cervical disc disorder with radiculopathy of cervical region   EBL: 50 cc IVF: See anesthesia report Drains: none Disposition: Extubated and Stable to PACU Complications: none  No foley catheter was placed.   Preoperative Note:   Risks of surgery discussed include: infection, bleeding, stroke, coma, death, paralysis, CSF leak, nerve/spinal cord injury, numbness, tingling, weakness, complex regional pain syndrome, recurrent stenosis and/or disc herniation, vascular injury, development of instability, neck/back pain, need for further surgery, persistent symptoms, development of deformity, and the risks of anesthesia. The patient understood these risks and agreed to proceed.  Operative Note:  Procedure:  1) Cervical Disc Arthroplasty at C5/7 using a LDR Mobi-C device   Procedure: After obtaining informed consent, the patient taken to the operating room, placed in supine position, general anesthesia induced.  The patient had a small shoulder roll placed behind the neck.  The patient received preop antibiotics and IV Decadron .  The patient had a neck incision outlined, was prepped and draped in usual sterile fashion. The incision was injected with local anesthetic.   An incision was opened, dissection taken down medial to the  carotid artery and jugular vein, lateral to the trachea and esophagus.  The prevertebral fascia identified and a localizing x-ray demonstrated the correct level.  The longus colli were dissected laterally, and self-retaining retractors placed to open the operative field. The microscope was then brought into the field.  With this complete, distractor pins were placed in the vertebral bodies of C 5 and C 7. The distractor was placed, and the annulus at C 5/7 was opened using a bovie.  Curettes and pituitary rongeurs used to remove the majority of disk, then the drill was used to remove the posterior osteophyte and begin the foraminotomies. The nerve hook was used to elevate the posterior longitudinal ligament, which was then removed with Kerrison rongeurs. The microblunt nerve hook could be passed out the foramen bilaterally.   Meticulous hemostasis was obtained.  A trial spacer was used to size the disc space. Using flouroscopic guidance, a 15 mm width x 15 mm depth x 4.5 mm height Mobi-C was then inserted in the prepared disc space.  With this complete, distractor pins were placed in the vertebral bodies of C 6 and C 7. The distractor was placed, and the annulus at C 6/7 was opened using a bovie.  Curettes and pituitary rongeurs used to remove the majority of disk, then the drill was used to remove the posterior osteophyte and begin the foraminotomies. The nerve hook was used to elevate the posterior longitudinal ligament, which was then removed with Kerrison rongeurs. The microblunt nerve hook could be passed out the foramen bilaterally.   Meticulous hemostasis was obtained.  A trial spacer was used to size the disc space. Using flouroscopic guidance, a 17 mm width x 15 mm depth x 4.5 mm height Mobi-C was then inserted in  the prepared disc space.  The caspar distractor was removed, and bone wax used for hemostasis. Final AP and lateral radiographs were taken.   With the disc arthroplasty in good position,  the wound was irrigated copiously and meticulous hemostasis obtained.  Wound was closed in 2 layers using interrupted inverted 3-0 Vicryl sutures.  The wound was dressed with dermabond, the head of bed at 30 degrees, taken to recovery room in stable condition.  No new postop neurological deficits were identified.  Sponge and pattie counts were correct at the end of the procedure.   I performed the entire procedure with the assistance of Edsel Goods, PA-C as an Designer, television/film set. An assistant was required for this procedure due to the complexity.  The assistant provided assistance in tissue manipulation and suction, and was required for the successful and safe performance of the procedure. I performed the critical portions of the procedure.   Penne MICAEL Sharps, MD   Implant Name Type Inv. Item Serial No. Manufacturer Lot No. LRB No. Used Action  DISC MOBI-C CERVICAL [id number] - [id number] [employer] DISC [facility] CERVICAL [id number]  [facility] [id number] N/A 1 Implanted  DISC MOBI-C CERV STD 15X17 H4. - [id number] Miscellaneous DISC MOBI-C CERV STD 15X17 H4.  [facility] [employer] [id number] N/A 1 Implanted

## 2024-02-13 NOTE — Anesthesia Procedure Notes (Signed)
 Procedure Name: Intubation Date/Time: 02/13/2024 10:54 AM  Performed by: Lorriane Arabia, CRNAPre-anesthesia Checklist: Patient identified, Patient being monitored, Timeout performed, Emergency Drugs available and Suction available Patient Re-evaluated:Patient Re-evaluated prior to induction Oxygen Delivery Method: Circle system utilized Preoxygenation: Pre-oxygenation with 100% oxygen Induction Type: IV induction Ventilation: Mask ventilation without difficulty Laryngoscope Size: 3 and McGrath Grade View: Grade I Tube type: Oral Tube size: 7.0 mm Number of attempts: 1 Airway Equipment and Method: Stylet Placement Confirmation: ETT inserted through vocal cords under direct vision, positive ETCO2 and breath sounds checked- equal and bilateral Secured at: 21 cm Tube secured with: Tape Dental Injury: Teeth and Oropharynx as per pre-operative assessment

## 2024-02-13 NOTE — Discharge Instructions (Signed)
 Your surgeon has performed an operation on your cervical spine (neck) to relieve pressure on the spinal cord and/or nerves. This involved making an incision in the front of your neck and removing one or more of the discs that support your spine. Next, a small piece of bone, a titanium plate, and screws were used to fuse two or more of the vertebrae (bones) together.  The following are instructions to help in your recovery once you have been discharged from the hospital. Even if you feel well, it is important that you follow these activity guidelines. If you do not let your neck heal properly from the surgery, you can increase the chance of return of your symptoms and other complications.   *Regarding compression stockings-  Please wear day and night until you are walking a couple hundred feet three times a day.   Activity    No bending, lifting, or twisting ("BLT"). Avoid lifting objects heavier than 10 pounds (gallon milk jug).  Where possible, avoid household activities that involve lifting, bending, reaching, pushing, or pulling such as laundry, vacuuming, grocery shopping, and childcare. Try to arrange for help from friends and family for these activities while your back heals.  Increase physical activity slowly as tolerated.  Taking short walks is encouraged, but avoid strenuous exercise. Do not jog, run, bicycle, lift weights, or participate in any other exercises unless specifically allowed by your doctor.  Talk to your doctor before resuming sexual activity.  You should not drive until cleared by your doctor.  Until released by your doctor, you should not return to work or school.  You should rest at home and let your body heal.   You may shower three days after your surgery.  After showering, lightly dab your incision dry. Do not take a tub bath or go swimming until approved by your doctor at your follow-up appointment.  If your doctor ordered a cervical collar (neck brace) for you,  you should wear it whenever you are out of bed. You may remove it when lying down or sleeping, but you should wear it at all other times. Not all neck surgeries require a cervical collar.  If you smoke, we strongly recommend that you quit.  Smoking has been proven to interfere with normal bone healing and will dramatically reduce the success rate of your surgery. Please contact [facility] (800-QUIT-NOW) and use the resources at www.[facility].com for assistance in stopping smoking.  Surgical Incision   If you have a dressing on your incision, you may remove it two days after your surgery. Keep your incision area clean and dry.  If you have staples or stitches on your incision, you should have a follow up scheduled for removal. If you do not have staples or stitches, you will have steri-strips (small pieces of surgical tape) or Dermabond glue. The steri-strips/glue should begin to peel away within about a week (it is fine if the steri-strips fall off before then). If the strips are still in place one week after your surgery, you may gently remove them.  Diet           You may return to your usual diet. However, you may experience discomfort when swallowing in the first month after your surgery. This is normal. You may find that softer foods are more comfortable for you to swallow. Be sure to stay hydrated.  When to Contact Us   You may experience pain in your neck and/or pain between your shoulder blades. This is normal and  should improve in the next few weeks with the help of pain medication, muscle relaxers, and rest. Some patients report that a warm compress on the back of the neck or between the shoulder blades helps.  However, should you experience any of the following, contact us  immediately: New numbness or weakness Pain that is progressively getting worse, and is not relieved by your pain medication, muscle relaxers, rest, and warm compresses Bleeding, redness, swelling, pain, or drainage  from surgical incision Chills or flu-like symptoms Fever greater than 101.0 F (38.3 C) Inability to eat, drink fluids, or take medications Problems with bowel or bladder functions Difficulty breathing or shortness of breath Warmth, tenderness, or swelling in your calf Contact Information How to contact us :  If you have any questions/concerns before or after surgery, you can reach us  at [phone], or you can send a mychart message. We can be reached by phone or mychart 8am-4pm, Monday-Friday.  *Please note: Calls after 4pm are forwarded to a third party answering service. Mychart messages are not routinely monitored during evenings, weekends, and holidays. Please call our office to contact the answering service for urgent concerns during non-business hours.

## 2024-02-14 ENCOUNTER — Encounter: Payer: Self-pay | Admitting: Neurosurgery

## 2024-02-14 ENCOUNTER — Other Ambulatory Visit: Payer: Self-pay

## 2024-02-14 DIAGNOSIS — M5010 Cervical disc disorder with radiculopathy, unspecified cervical region: Secondary | ICD-10-CM | POA: Diagnosis not present

## 2024-02-14 MED ORDER — METHOCARBAMOL 500 MG PO TABS
500.0000 mg | ORAL_TABLET | Freq: Four times a day (QID) | ORAL | 0 refills | Status: DC | PRN
  Filled 2024-02-14: qty 120, 30d supply, fill #0

## 2024-02-14 MED ORDER — OXYCODONE HCL 5 MG PO TABS
5.0000 mg | ORAL_TABLET | ORAL | 0 refills | Status: DC | PRN
  Filled 2024-02-14: qty 30, 5d supply, fill #0

## 2024-02-14 MED ORDER — SENNA 8.6 MG PO TABS
1.0000 | ORAL_TABLET | Freq: Two times a day (BID) | ORAL | 0 refills | Status: DC | PRN
  Filled 2024-02-14: qty 30, 15d supply, fill #0

## 2024-02-14 NOTE — Progress Notes (Signed)
   Neurosurgery Progress Note  History: Andrea Chapman is s/p C5-7 arthroplasty  POD1: pt is doing well this morning with resolution of her pre-op right arm pain  Physical Exam: Vitals:   02/14/24 0028 02/14/24 0422  BP: 130/80 136/78  Pulse: 80 74  Resp: 18 18  Temp: 98.3 F (36.8 C) (!) 97 F (36.1 C)  SpO2: 98% 95%    AA Ox3 CNI  Strength:5/5 throughout BUE Incision: c/d/I with dermabond in place  Data:  Other tests/results: NA  Assessment/Plan:  Andrea Chapman is a 48 y.o presenting with cervical radiculopathy s/p C5-7 arthroplasty by Dr. Claudene  - mobilize - pain control - DVT prophylaxis - PTOT; dispo planning pending final recommendations  Edsel Goods PA-C Department of Neurosurgery

## 2024-02-14 NOTE — Evaluation (Signed)
 Occupational Therapy Evaluation Patient Details Name: Andrea Chapman MRN: [id number] DOB: April 24, 1976 Today's Date: 02/14/2024   History of Present Illness   48 y.o presenting with cervical radiculopathy s/p C5-7 arthroplasty on 02/13/24.     Clinical Impressions Pt was seen for OT evaluation this date. Prior to hospital surgery, pt was mod indep, limited by RUE pain and numbness/tingling. Pt lives her spouse and dog in a 2 story home and reports plan to get set up on 1st floor and sleep in the recliner in the short term to limit going up/down the flight of stairs. Pt presents with deficits in cervical incision pain and decreased cervical ROM with cervical precautions, affecting safe and optimal ADL completion. Pt currently requires PRN assist for LB ADL tasks in order to maintain precautions. Spouse present and supportive, reports ability to provide assist at DC. Pt/spouse instructed in cervical body mechanics and how to maintain during ADL/mobility, car transfers, falls prevention, home/routines modifications, AE/DME, and pet care considerations. Handout provided to support recall and carryover. Pt/spouse verbalized understanding. Do not anticipate the need for follow up OT services upon acute hospital DC. Care team notified.     If plan is discharge home, recommend the following:   A little help with bathing/dressing/bathroom;Assistance with cooking/housework;Assist for transportation;Help with stairs or ramp for entrance     Functional Status Assessment   Patient has had a recent decline in their functional status and demonstrates the ability to make significant improvements in function in a reasonable and predictable amount of time.     Equipment Recommendations   None recommended by OT     Recommendations for Other Services         Precautions/Restrictions   Precautions Precautions: Cervical Precaution Booklet Issued: Yes (comment) Recall of Precautions/Restrictions:  Intact Restrictions Weight Bearing Restrictions Per Provider Order: No     Mobility Bed Mobility               General bed mobility comments: Declined, just back from the bathroom. PT and pt report pt ambulatory without AD.    Transfers                          Balance                                           ADL either performed or assessed with clinical judgement   ADL Overall ADL's : Modified independent                                       General ADL Comments: Pt requires PRN assist for LB ADL tasks in order to maintain cervical body mechanics. Spouse present, supportive, and endorses ability to provide assist at discharge.     Vision         Perception         Praxis         Pertinent Vitals/Pain Pain Assessment Pain Assessment: 0-10 Pain Score: 7  Pain Location: incision Pain Descriptors / Indicators: Aching Pain Intervention(s): Limited activity within patient's tolerance, Monitored during session, Premedicated before session, Repositioned, Patient requesting pain meds-RN notified     Extremity/Trunk Assessment Upper Extremity Assessment Upper Extremity Assessment: Overall WFL for tasks assessed (Pt reports symptoms in RUE  resolved since surgery)   Lower Extremity Assessment Lower Extremity Assessment: Defer to PT evaluation   Cervical / Trunk Assessment Cervical / Trunk Assessment: Neck Surgery   Communication Communication Communication: No apparent difficulties   Cognition Arousal: Alert Behavior During Therapy: WFL for tasks assessed/performed Cognition: No apparent impairments                               Following commands: Intact       Cueing  General Comments          Exercises Other Exercises Other Exercises: Pt/spouse instructed in cervical body mechanics and how to maintain during ADL/mobility, car transfers, falls prevention, home/routines modifications,  AE/DME, and pet care considerations. Handout provided to support recall and carryover.   Shoulder Instructions      Home Living Family/patient expects to be discharged to:: Private residence Living Arrangements: Spouse/significant other Available Help at Discharge: Family Type of Home: House Home Access: Stairs to enter Secretary/administrator of Steps: 2 Entrance Stairs-Rails: None Home Layout: Two level Alternate Level Stairs-Number of Steps: 15 steps to 2nd floor but able to live on main with recliner Alternate Level Stairs-Rails: Left Bathroom Shower/Tub: Chief Strategy Officer: Handicapped height Bathroom Accessibility: No   Home Equipment: Hand held shower head   Additional Comments: Pt lives with spouse who is able to help when needed. She reports not driving and not working at this time.      Prior Functioning/Environment Prior Level of Function : Independent/Modified Independent             Mobility Comments: Pt does not use any AD for mobility. ADLs Comments: Pt is independent with all ADLs.    OT Problem List: Pain;Decreased range of motion;Decreased knowledge of precautions   OT Treatment/Interventions:        OT Goals(Current goals can be found in the care plan section)   Acute Rehab OT Goals Patient Stated Goal: go home OT Goal Formulation: All assessment and education complete, DC therapy   OT Frequency:       Co-evaluation              AM-PAC OT 6 Clicks Daily Activity     Outcome Measure Help from another person eating meals?: None Help from another person taking care of personal grooming?: None Help from another person toileting, which includes using toliet, bedpan, or urinal?: None Help from another person bathing (including washing, rinsing, drying)?: A Little Help from another person to put on and taking off regular upper body clothing?: None Help from another person to put on and taking off regular lower body  clothing?: A Little 6 Click Score: 22   End of Session Nurse Communication: Mobility status;Patient requests pain meds  Activity Tolerance: Patient tolerated treatment well Patient left: in bed;with call bell/phone within reach;with family/visitor present  OT Visit Diagnosis: Other abnormalities of gait and mobility (R26.89);Pain                Time: 0950-1003 OT Time Calculation (min): 13 min Charges:  OT General Charges $OT Visit: 1 Visit OT Evaluation $OT Eval Low Complexity: 1 Low OT Treatments $Self Care/Home Management : 8-22 mins  Warren SAUNDERS., MPH, MS, OTR/L ascom [phone] 02/14/24, 10:26 AM

## 2024-02-14 NOTE — Progress Notes (Addendum)
 VSS. No signs of acute distress. New prescription delivered at bedside by this RN. (Oxycodone , robaxin  and sennakot).Discharged to home order received from MD/PA. Discharge instructions given to patient and spouse. Patient and spouse verbalized understanding. No other complaints.

## 2024-02-14 NOTE — Evaluation (Signed)
 Physical Therapy Evaluation Patient Details Name: ANELLE PARLOW MRN: [id number] DOB: 04-Jul-1975 Today's Date: 02/14/2024  History of Present Illness  48 y.o presenting with cervical radiculopathy s/p C5-7 arthroplasty on 02/13/24.  Clinical Impression  Pt is a pleasant 47 year old female who was POD 1 after cervical arthroplasty at c5/7. Pt performs bed mobility, transfers, and ambulation with CGA-SBA. Pt demonstrates deficits with neck pain and ROM, however this is expected after surgery. Pt PLOF is independent with all ADLs and did not require use of AD for ambulation. Pt can benefit from skilled PT services as surgery heals to improve deficits mentioned above. Pt does not require any further PT needs at this time. Pt will be dc in house and does not require follow up. RN aware. Will dc current orders.          If plan is discharge home, recommend the following: A little help with walking and/or transfers;A little help with bathing/dressing/bathroom;Assist for transportation;Help with stairs or ramp for entrance   Can travel by private vehicle        Equipment Recommendations None recommended by PT  Recommendations for Other Services       Functional Status Assessment Patient has not had a recent decline in their functional status     Precautions / Restrictions Precautions Precautions: None Recall of Precautions/Restrictions: Intact Restrictions Weight Bearing Restrictions Per Provider Order: No      Mobility  Bed Mobility Overal bed mobility: Modified Independent             General bed mobility comments: Pt able to get in and out of bed slowly/with caution d/t pain.    Transfers Overall transfer level: Needs assistance Equipment used: Rolling walker (2 wheels) Transfers: Sit to/from Stand Sit to Stand: Supervision           General transfer comment: PT provided RW for pt upon standing d/t pt report of feeling weird. PT provided SBA for safety     Ambulation/Gait Ambulation/Gait assistance: Contact guard assist Gait Distance (Feet): 200 Feet Assistive device: Rolling walker (2 wheels), None Gait Pattern/deviations: Step-through pattern, Decreased step length - right, Decreased step length - left Gait velocity: decreased     General Gait Details: Pt ambulated to stairs using the RW, however when walking back to the room, pt did not use the RW. PT provided CGA-SBA for safety. Pt demonstrates small step length with ambulation and reports her legs feeling heavy.  Stairs Stairs: Yes Stairs assistance: Supervision Stair Management: One rail Left Number of Stairs: 4 General stair comments: Pt able to negotiate stairs using reciprocal pattern. PT SBA for safety.  Wheelchair Mobility     Tilt Bed    Modified Rankin (Stroke Patients Only)       Balance Overall balance assessment: No apparent balance deficits (not formally assessed)                                           Pertinent Vitals/Pain Pain Assessment Pain Assessment: 0-10 Pain Score: 4  Pain Location: neck Pain Intervention(s): Monitored during session, Premedicated before session    Home Living Family/patient expects to be discharged to:: Private residence Living Arrangements: Spouse/significant other Available Help at Discharge: Family Type of Home: House Home Access: Stairs to enter Entrance Stairs-Rails: None Entrance Stairs-Number of Steps: 2 Alternate Level Stairs-Number of Steps: 15 steps to 2nd floor but  able to live on main with recliner Home Layout: Two level Home Equipment: Hand held shower head Additional Comments: Pt lives with spouse who is able to help when needed. She reports not driving and not working at this time.    Prior Function Prior Level of Function : Independent/Modified Independent             Mobility Comments: Pt does not use any AD for mobility. ADLs Comments: Pt is independent with all ADLs.      Extremity/Trunk Assessment   Upper Extremity Assessment Upper Extremity Assessment: Overall WFL for tasks assessed (Pt reports symptoms in RUE resolved since surgery)    Lower Extremity Assessment Lower Extremity Assessment: Defer to PT evaluation    Cervical / Trunk Assessment Cervical / Trunk Assessment: Neck Surgery  Communication   Communication Communication: No apparent difficulties    Cognition Arousal: Alert Behavior During Therapy: WFL for tasks assessed/performed   PT - Cognitive impairments: No apparent impairments                       PT - Cognition Comments: Pt is pleasant and agreeable to physical therapy. She is awake and alert. Following commands: Intact       Cueing Cueing Techniques: Verbal cues     General Comments      Exercises     Assessment/Plan    PT Assessment Patient does not need any further PT services  PT Problem List         PT Treatment Interventions      PT Goals (Current goals can be found in the Care Plan section)  Acute Rehab PT Goals Patient Stated Goal: To go home PT Goal Formulation: With patient Time For Goal Achievement: 02/28/24 Potential to Achieve Goals: Good    Frequency       Co-evaluation               AM-PAC PT 6 Clicks Mobility  Outcome Measure Help needed turning from your back to your side while in a flat bed without using bedrails?: A Little Help needed moving from lying on your back to sitting on the side of a flat bed without using bedrails?: A Little Help needed moving to and from a bed to a chair (including a wheelchair)?: None Help needed standing up from a chair using your arms (e.g., wheelchair or bedside chair)?: None Help needed to walk in hospital room?: None Help needed climbing 3-5 steps with a railing? : None 6 Click Score: 22    End of Session Equipment Utilized During Treatment: Gait belt Activity Tolerance: Patient tolerated treatment well Patient left: in  chair;with call bell/phone within reach;with family/visitor present Nurse Communication: Mobility status PT Visit Diagnosis: Unsteadiness on feet (R26.81)    Time: 9089-9076 PT Time Calculation (min) (ACUTE ONLY): 13 min   Charges:                 Mazelle Huebert, SPT   Keahi Mccarney 02/14/2024, 10:35 AM

## 2024-02-14 NOTE — Discharge Summary (Signed)
 Discharge Summary  Patient ID: Andrea Chapman MRN: [id number] DOB/AGE: [birth year] 48 y.o.  Admit date: 02/13/2024 Discharge date: 02/14/2024  Admission Diagnoses: M50.10 Cervical disc disorder with radiculopathy of cervical region  Discharge Diagnoses:  Principal Problem:   Cervical radiculopathy Active Problems:   Cervical disc disorder with radiculopathy of cervical region   Discharged Condition: good  Hospital Course:  Andrea Chapman is a 48 y.o presenting with cervical radiculopathy s/p C5-7 arthroplasty. Her intraoperative course was uncomplicated. She was admitted for pain control and therapy evaluation. She was seen and evaluated by therapy and was deemed appropriate for discharge home on POD1 without further needs. She was given prescriptions for Oxycodone , Robaxin , and Senna to take as needed.   Consults: None  Significant Diagnostic Studies: NA  Treatments: surgery: as above. Please see separately dictated operative report for further details  Discharge Exam: Blood pressure 125/70, pulse 64, temperature 98.4 F (36.9 C), temperature source Oral, resp. rate 18, height 5' 4 (1.626 m), weight 81.6 kg, last menstrual period 05/16/2010, SpO2 98%. CN grossly intact 5/5 throughout BUE Incision c/d/i  Disposition: Discharge disposition: 01-Home or Self Care       Discharge Instructions     Incentive spirometry RT   Complete by: As directed    No dressing needed   Complete by: As directed       Allergies as of 02/14/2024       Reactions   Azithromycin Other (See Comments)   Hearing loss   Metoclopramide Itching, Other (See Comments)   Hallucination   Morphine Itching, Other (See Comments)   Hallucination        Medication List     STOP taking these medications    HYDROcodone-acetaminophen  5-325 MG tablet Commonly known as: NORCO/VICODIN       TAKE these medications    ARIPiprazole  5 MG tablet Commonly known as: ABILIFY  Take 5 mg by mouth daily.    atorvastatin  10 MG tablet Commonly known as: LIPITOR Take 10 mg by mouth daily.   cyanocobalamin 1000 MCG/ML injection Commonly known as: VITAMIN B12 Inject 1,000 mcg into the skin every 30 (thirty) days.   levothyroxine  112 MCG tablet Commonly known as: SYNTHROID  Take 112 mcg by mouth daily.   methocarbamol  500 MG tablet Commonly known as: ROBAXIN  Take 1 tablet (500 mg total) by mouth every 6 (six) hours as needed for muscle spasms.   metoprolol  succinate 25 MG 24 hr tablet Commonly known as: TOPROL -XL Take 25 mg by mouth daily.   oxyCODONE  5 MG immediate release tablet Commonly known as: Oxy IR/ROXICODONE  Take 1 tablet (5 mg total) by mouth every 4 (four) hours as needed for moderate pain (pain score 4-6).   pantoprazole  20 MG tablet Commonly known as: PROTONIX  Take 20 mg by mouth daily.   senna 8.6 MG Tabs tablet Commonly known as: SENOKOT Take 1 tablet (8.6 mg total) by mouth 2 (two) times daily as needed for mild constipation.   venlafaxine  100 MG tablet Commonly known as: EFFEXOR  Take 100 mg by mouth 2 (two) times daily.   Well Vitamin D3 125 MCG (5000 UT) capsule Generic drug: Cholecalciferol  Take 5,000 Units by mouth daily.               Discharge Care Instructions  (From admission, onward)           Start     Ordered   02/14/24 0000  No dressing needed        02/14/24 0945  Signed: Edsel Jama Goods 02/14/2024, 9:45 AM

## 2024-02-26 ENCOUNTER — Ambulatory Visit (INDEPENDENT_AMBULATORY_CARE_PROVIDER_SITE_OTHER): Admitting: Physician Assistant

## 2024-02-26 ENCOUNTER — Encounter: Payer: Self-pay | Admitting: Physician Assistant

## 2024-02-26 VITALS — BP 104/62 | Temp 98.2°F | Ht 64.0 in | Wt 175.0 lb

## 2024-02-26 DIAGNOSIS — Z09 Encounter for follow-up examination after completed treatment for conditions other than malignant neoplasm: Secondary | ICD-10-CM

## 2024-02-26 DIAGNOSIS — M5010 Cervical disc disorder with radiculopathy, unspecified cervical region: Secondary | ICD-10-CM

## 2024-02-26 DIAGNOSIS — Z9889 Other specified postprocedural states: Secondary | ICD-10-CM

## 2024-02-26 NOTE — Progress Notes (Unsigned)
   REFERRING PHYSICIAN:  Sadie Manna, Md [address] [place],  [place] [address]  DOS: 02/13/24  HISTORY OF PRESENT ILLNESS: Andrea Chapman is 2 weeks status post C5-7 arthroplasty. Arm pain and neck pain has improved. Overall, she is doing very well.  PHYSICAL EXAMINATION:  NEUROLOGICAL:  General: In no acute distress.   Awake, alert, oriented to person, place, and time.  Pupils equal round and reactive to light.  Facial tone is symmetric.    Strength: Side Biceps Triceps Deltoid Interossei Grip Wrist Ext. Wrist Flex.  R 5 5 5 5 5 5 5   L 5 5 5 5 5 5 5    Incision c/d/I  Imaging:  No new imaging prior to this appointment  Assessment / Plan: Andrea Chapman is doing well after C5-7 arthroplasty on 02/13/24. We discussed activity escalation and I have advised the patient to lift up to 10 pounds until 6 weeks after surgery, then increase up to 25 pounds until 12 weeks after surgery.  After 12 weeks post-op, the patient advised to increase activity as tolerated. she will return to clinic in approximately one month for her 6 week post-op visit.    Advised to contact the office if any questions or concerns arise.   Lyle Decamp PA-C Dept of Neurosurgery

## 2024-02-27 ENCOUNTER — Other Ambulatory Visit: Payer: Self-pay | Admitting: Physician Assistant

## 2024-02-27 MED ORDER — OXYCODONE HCL 5 MG PO TABS: 5.0000 mg | ORAL_TABLET | ORAL | 0 refills | Status: DC | PRN

## 2024-03-04 ENCOUNTER — Encounter: Admitting: Neurosurgery

## 2024-03-19 ENCOUNTER — Other Ambulatory Visit: Payer: Self-pay | Admitting: Internal Medicine

## 2024-03-19 DIAGNOSIS — Z1231 Encounter for screening mammogram for malignant neoplasm of breast: Secondary | ICD-10-CM

## 2024-03-23 ENCOUNTER — Ambulatory Visit
Admission: RE | Admit: 2024-03-23 | Discharge: 2024-03-23 | Disposition: A | Discharge status: Home / Self Care | Source: Ambulatory Visit | Attending: Internal Medicine | Admitting: Internal Medicine

## 2024-03-23 DIAGNOSIS — Z1231 Encounter for screening mammogram for malignant neoplasm of breast: Secondary | ICD-10-CM | POA: Diagnosis present

## 2024-03-24 ENCOUNTER — Other Ambulatory Visit: Payer: Self-pay

## 2024-03-24 DIAGNOSIS — M5010 Cervical disc disorder with radiculopathy, unspecified cervical region: Secondary | ICD-10-CM

## 2024-03-25 ENCOUNTER — Ambulatory Visit
Admission: RE | Admit: 2024-03-25 | Discharge: 2024-03-25 | Disposition: A | Discharge status: Home / Self Care | Source: Ambulatory Visit | Attending: Neurosurgery | Admitting: Neurosurgery

## 2024-03-25 ENCOUNTER — Ambulatory Visit
Admission: RE | Admit: 2024-03-25 | Discharge: 2024-03-25 | Disposition: A | Discharge status: Home / Self Care | Attending: Neurosurgery | Admitting: Neurosurgery

## 2024-03-25 ENCOUNTER — Ambulatory Visit (INDEPENDENT_AMBULATORY_CARE_PROVIDER_SITE_OTHER): Admitting: Neurosurgery

## 2024-03-25 VITALS — BP 116/74 | Temp 99.0°F | Ht 64.0 in | Wt 175.0 lb

## 2024-03-25 DIAGNOSIS — M5010 Cervical disc disorder with radiculopathy, unspecified cervical region: Secondary | ICD-10-CM | POA: Insufficient documentation

## 2024-03-25 DIAGNOSIS — Z09 Encounter for follow-up examination after completed treatment for conditions other than malignant neoplasm: Secondary | ICD-10-CM

## 2024-03-25 NOTE — Progress Notes (Signed)
   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  She states that she is doing very well after surgery.  She feels like her arm and neck pain have improved significantly.  The only thing is she notices that she has slightly less active range of motion as she did previously.  She states that this is not bothersome is just something that she noticed while she is starting to recover.  PHYSICAL EXAMINATION:  NEUROLOGICAL:  General: In no acute distress.   Awake, alert, oriented to person, place, and time.  Pupils equal round and reactive to light.  Facial tone is symmetric.    Strength: Side Biceps Triceps Deltoid Interossei Grip Wrist Ext. Wrist Flex.  R 5 5 5 5 5 5 5   L 5 5 5 5 5 5 5    Incision c/d/I  Imaging:  No new imaging prior to this appointment  Assessment / Plan: Andrea Chapman is doing well after C5-7 arthroplasty on 02/13/24.  Overall she is doing very well, she has had an improvement in her strength and an improvement in her pain.  She feels the only thing that she notices after surgery is slight decrease in range of motion but she is happy overall.  At this point she can increase her lifting restriction to 25 pounds.  Would look forward to seeing her at her 12-week follow-up.  X-rays were done today and hardware appears to be in good position.  Will follow-up on final reads.    Penne MICAEL Sharps, MD Dept of Neurosurgery

## 2024-03-31 ENCOUNTER — Ambulatory Visit: Payer: Self-pay | Admitting: Neurosurgery

## 2024-04-15 ENCOUNTER — Encounter: Admitting: Physician Assistant

## 2024-04-30 ENCOUNTER — Other Ambulatory Visit: Payer: Self-pay

## 2024-04-30 DIAGNOSIS — M5010 Cervical disc disorder with radiculopathy, unspecified cervical region: Secondary | ICD-10-CM

## 2024-05-06 ENCOUNTER — Ambulatory Visit (INDEPENDENT_AMBULATORY_CARE_PROVIDER_SITE_OTHER): Admitting: Physician Assistant

## 2024-05-06 ENCOUNTER — Ambulatory Visit (INDEPENDENT_AMBULATORY_CARE_PROVIDER_SITE_OTHER)

## 2024-05-06 ENCOUNTER — Encounter: Payer: Self-pay | Admitting: Physician Assistant

## 2024-05-06 VITALS — BP 112/70 | Temp 98.4°F | Ht 64.0 in | Wt 183.0 lb

## 2024-05-06 DIAGNOSIS — M5010 Cervical disc disorder with radiculopathy, unspecified cervical region: Secondary | ICD-10-CM

## 2024-05-06 DIAGNOSIS — Z9889 Other specified postprocedural states: Secondary | ICD-10-CM

## 2024-05-06 NOTE — Progress Notes (Signed)
   REFERRING PHYSICIAN:  Sadie Manna, Md [address] [place],  [place] [address]  DOS: 02/13/24  HISTORY OF PRESENT ILLNESS: Andrea Chapman is 12 weeks status post C5-7 arthroplasty.  She states that she is doing very well after surgery.  She feels like her arm and neck pain have improved significantly.  She is very happy with her progress.   PHYSICAL EXAMINATION:  NEUROLOGICAL:  General: In no acute distress.   Awake, alert, oriented to person, place, and time.  Pupils equal round and reactive to light.  Facial tone is symmetric.    Strength: Side Biceps Triceps Deltoid Interossei Grip Wrist Ext. Wrist Flex.  R 5 5 5 5 5 5 5   L 5 5 5 5 5 5 5    Incision c/d/I  Imaging:  No new imaging prior to this appointment  Assessment / Plan: Andrea Chapman is doing well after C5-7 arthroplasty on 02/13/24.  Overall she is doing very well, she has had an improvement in her strength and an improvement in her pain. She has been very happy with her results. She may follow-up with us  on an as needed basis.  Lyle Decamp, PA-C Dept of Neurosurgery

## 2024-06-22 ENCOUNTER — Other Ambulatory Visit (HOSPITAL_COMMUNITY): Payer: Self-pay

## 2024-07-21 ENCOUNTER — Other Ambulatory Visit: Payer: Self-pay
